# Patient Record
Sex: Male | Born: 1955 | Race: White | Hispanic: No | Marital: Single | Smoking: Never smoker
Health system: Southern US, Community
[De-identification: ages and names within clinical notes are randomized; demographics above are authoritative.]

## PROBLEM LIST (undated history)

## (undated) DIAGNOSIS — C61 Malignant neoplasm of prostate: Secondary | ICD-10-CM

## (undated) DIAGNOSIS — F419 Anxiety disorder, unspecified: Secondary | ICD-10-CM

## (undated) DIAGNOSIS — F329 Major depressive disorder, single episode, unspecified: Secondary | ICD-10-CM

## (undated) DIAGNOSIS — Z9889 Other specified postprocedural states: Secondary | ICD-10-CM

## (undated) DIAGNOSIS — F32A Depression, unspecified: Secondary | ICD-10-CM

## (undated) DIAGNOSIS — R112 Nausea with vomiting, unspecified: Secondary | ICD-10-CM

## (undated) DIAGNOSIS — K219 Gastro-esophageal reflux disease without esophagitis: Secondary | ICD-10-CM

## (undated) HISTORY — PX: XI ROBOTIC ASSISTED SIMPLE PROSTATECTOMY: SHX6713

## (undated) HISTORY — PX: ANKLE ARTHROSCOPY: SUR85

## (undated) HISTORY — PX: HAND SURGERY: SHX662

## (undated) HISTORY — PX: PROSTATE BIOPSY: SHX241

---

## 2005-04-13 ENCOUNTER — Ambulatory Visit (HOSPITAL_COMMUNITY): Admission: RE | Admit: 2005-04-13 | Discharge: 2005-04-13 | Payer: Self-pay | Admitting: Orthopedic Surgery

## 2011-10-11 ENCOUNTER — Ambulatory Visit (INDEPENDENT_AMBULATORY_CARE_PROVIDER_SITE_OTHER): Payer: 59 | Admitting: Psychology

## 2011-10-11 DIAGNOSIS — F331 Major depressive disorder, recurrent, moderate: Secondary | ICD-10-CM

## 2011-10-11 DIAGNOSIS — F411 Generalized anxiety disorder: Secondary | ICD-10-CM

## 2011-10-22 ENCOUNTER — Ambulatory Visit: Payer: 59 | Admitting: Psychology

## 2011-10-23 ENCOUNTER — Ambulatory Visit (INDEPENDENT_AMBULATORY_CARE_PROVIDER_SITE_OTHER): Payer: 59 | Admitting: Psychology

## 2011-10-23 DIAGNOSIS — F331 Major depressive disorder, recurrent, moderate: Secondary | ICD-10-CM

## 2011-10-23 DIAGNOSIS — F411 Generalized anxiety disorder: Secondary | ICD-10-CM

## 2011-12-06 ENCOUNTER — Ambulatory Visit (HOSPITAL_COMMUNITY): Admission: RE | Admit: 2011-12-06 | Payer: 59 | Source: Ambulatory Visit | Admitting: Gastroenterology

## 2011-12-06 ENCOUNTER — Encounter (HOSPITAL_COMMUNITY): Admission: RE | Payer: Self-pay | Source: Ambulatory Visit

## 2011-12-06 SURGERY — COLONOSCOPY
Anesthesia: Moderate Sedation

## 2013-06-20 ENCOUNTER — Emergency Department (INDEPENDENT_AMBULATORY_CARE_PROVIDER_SITE_OTHER)
Admission: EM | Admit: 2013-06-20 | Discharge: 2013-06-20 | Disposition: A | Discharge status: Home / Self Care | Payer: PRIVATE HEALTH INSURANCE | Source: Home / Self Care | Attending: Family Medicine | Admitting: Family Medicine

## 2013-06-20 ENCOUNTER — Emergency Department (INDEPENDENT_AMBULATORY_CARE_PROVIDER_SITE_OTHER): Payer: PRIVATE HEALTH INSURANCE

## 2013-06-20 ENCOUNTER — Encounter (HOSPITAL_COMMUNITY): Payer: Self-pay | Admitting: *Deleted

## 2013-06-20 DIAGNOSIS — S62639B Displaced fracture of distal phalanx of unspecified finger, initial encounter for open fracture: Secondary | ICD-10-CM

## 2013-06-20 MED ORDER — TETANUS-DIPHTHERIA TOXOIDS TD 5-2 LFU IM INJ
INJECTION | INTRAMUSCULAR | Status: AC
  Filled 2013-06-20: qty 0.5

## 2013-06-20 MED ORDER — HYDROCODONE-ACETAMINOPHEN 7.5-325 MG PO TABS: 1.0000 | ORAL_TABLET | Freq: Four times a day (QID) | ORAL | Status: DC | PRN

## 2013-06-20 MED ORDER — TETANUS-DIPHTH-ACELL PERTUSSIS 5-2.5-18.5 LF-MCG/0.5 IM SUSP
0.5000 mL | Freq: Once | INTRAMUSCULAR | Status: AC
  Administered 2013-06-20: 0.5 mL via INTRAMUSCULAR

## 2013-06-20 MED ORDER — ONDANSETRON 4 MG PO TBDP
ORAL_TABLET | ORAL | Status: AC
  Filled 2013-06-20: qty 2

## 2013-06-20 NOTE — ED Provider Notes (Signed)
Medical screening examination/treatment/procedure(s) were performed by non-physician practitioner and as supervising physician I was immediately available for consultation/collaboration.  Raynald Blend, MD 06/20/13 1243

## 2013-06-20 NOTE — ED Notes (Signed)
Awaiting  Dr  Merlyn Lot      Remains  Npo

## 2013-06-20 NOTE — ED Provider Notes (Signed)
CSN: [id number]     Arrival date & time 06/20/13  0915 History     None    Chief Complaint  Patient presents with  . Finger Injury   (Consider location/radiation/quality/duration/timing/severity/associated sxs/prior Treatment) HPI Comments: 57 year old male presents for evaluation and treatment of a finger injury. He smashed his left middle finger under a heavy table about 20 minutes prior to arrival here. The finger is extremely painful. He has had significant bleeding from the finger that has been controlled with direct pressure. He denies any numbness distal to the injury, history of blood dyscrasias, or previous injury to the finger.   History reviewed. No pertinent past medical history. History reviewed. No pertinent past surgical history. History reviewed. No pertinent family history. History  Substance Use Topics  . Smoking status: Never Smoker   . Smokeless tobacco: Not on file  . Alcohol Use: No    Review of Systems  Constitutional: Negative for fever, chills and fatigue.  HENT: Negative for sore throat, neck pain and neck stiffness.   Eyes: Negative for visual disturbance.  Respiratory: Negative for cough and shortness of breath.   Cardiovascular: Negative for chest pain, palpitations and leg swelling.  Gastrointestinal: Positive for nausea. Negative for vomiting, abdominal pain, diarrhea and constipation.  Genitourinary: Negative for dysuria, urgency, frequency and hematuria.  Musculoskeletal: Negative for myalgias and arthralgias.  Skin: Positive for wound (see history of present illness). Negative for rash.  Neurological: Positive for light-headedness. Negative for dizziness and weakness.    Allergies  Review of patient's allergies indicates no known allergies.  Home Medications   Current Outpatient Rx  Name  Route  Sig  Dispense  Refill  . HYDROcodone-acetaminophen (NORCO) 7.5-325 MG per tablet   Oral   Take 1 tablet by mouth every 6 (six) hours as needed  for pain.   30 tablet   0    BP 140/76  Pulse 72  Temp(Src) 98.6 F (37 C) (Oral)  Resp 16  SpO2 100% Physical Exam  Nursing note and vitals reviewed. Constitutional: He is oriented to person, place, and time. He appears well-developed and well-nourished. He appears distressed.  Musculoskeletal:       Left hand: He exhibits decreased range of motion, tenderness, bony tenderness, decreased capillary refill (in the tissue lateral to the laceration), deformity, laceration and swelling.       Hands: Neurological: He is alert and oriented to person, place, and time. No cranial nerve deficit. Coordination normal.  Skin: Skin is warm. No rash noted. He is diaphoretic.  Psychiatric: He has a normal mood and affect. Judgment normal.    ED Course   Procedures (including critical care time)  Labs Reviewed - No data to display Dg Finger Middle Left  06/20/2013   *RADIOLOGY REPORT*  Clinical Data: Crush injury to left middle finger.  LEFT MIDDLE FINGER 2+V  Comparison: None  Findings: A comminuted tuft fracture is identified. There is no evidence of subluxation or dislocation. No unexpected radiopaque foreign bodies are identified.  IMPRESSION: Comminuted tuft fracture.   Original Report Authenticated By: Harmon Pier, M.D.   1. Open fracture of distal phalangeal tuft, initial encounter     Discussed with Dr. Merlyn Lot, hand surgeon on call. He is coming here to take a look at the patient's finger. He has requested that the patient remain n.p.o. and to ensure that his tetanus is up-to-date.  Dr. Merlyn Lot will fix this laceration in the office here today  MDM  Dr. Merlyn Lot has repaired  the laceration and will see this patient in the office this week.   Meds ordered this encounter  Medications  . TDaP (BOOSTRIX) injection 0.5 mL    Sig:   . HYDROcodone-acetaminophen (NORCO) 7.5-325 MG per tablet    Sig: Take 1 tablet by mouth every 6 (six) hours as needed for pain.    Dispense:  30 tablet     Refill:  0     Graylon Good, PA-C 06/20/13 1239

## 2013-06-20 NOTE — ED Notes (Signed)
Pt  Dropped  A  Heavy  Table  On  l        Middle  Finger  Lac  And       Pain    Swelling    /  Present

## 2013-06-20 NOTE — Consult Note (Signed)
NAMERYAN, OGBORN NO.:  [id number]  MEDICAL RECORD NO.:  [id number]  LOCATION:  [facility]                         FACILITY:  [facility]  PHYSICIAN:  Betha Loa, MD        DATE OF BIRTH:  03/27/1956  DATE OF CONSULTATION:  06/20/2013 DATE OF DISCHARGE:  06/20/2013                                CONSULTATION   Consult from [facility].  Consult is for left long finger tip crush injury.  HISTORY:  Steven Roy is a 57 year old right-hand dominant male, who states that this morning while moving some table top, he pinched the tip of the left long finger between the table top and the surface below. This caused lacerations to the fingertip as well as some hematoma.  He presented to [facility] where he was evaluated.  Radiographs revealed a tuft fracture.  I was consulted for management of the injury. He reports no previous injury and no other injury at this time.  ALLERGIES:  Penicillin cause an allergy as a child.  PAST MEDICAL HISTORY:  None.  PAST SURGICAL HISTORY:  Ankle fixation.  MEDICATIONS:  None.  SOCIAL HISTORY:  Steven Roy works for [employer].  He does not smoke or use alcohol.  REVIEW OF SYSTEMS:  A 13-point review of systems negative.  PHYSICAL EXAMINATION:  GENERAL:  Alert, oriented x3, well developed, well nourished, regular gait. EXTREMITIES:  Bilateral upper extremities are intact to light touch, sensation, capillary refill in all fingertips with the exception of the left long finger where he has had a digital block.  He has intact capillary refill.  There is subungual hematoma approximately 50% of the nail.  There is laceration on the radial side of the finger coursing around into the pad as well as a separate small laceration of the pad of the finger.  There was no gross contamination.  RADIOGRAPHS:  AP, lateral, and oblique views of the finger show tuft fracture with comminution.  There was no intra-articular  extension.  ASSESSMENT AND PLAN:  Left long fingertip crush injury.  Discussed with Steven Roy and his son, the nature of the injury.  We discussed treatment of the injury with irrigation and debridement of the Wounds and fracture and repair of wounds.  This will require removal of  the nail as well. Risks, benefits, and alternatives of procedure were  discussed and he wished to proceed.  PROCEDURE NOTE:  Digital block had been performed with 0.25% plain Marcaine by the urgent care staff.  This was adequate to give total digital anesthesia.  The wounds were copiously irrigated with 1000 mL of sterile saline and Betadine solution.  The finger was then prepped with Betadine and sterilely draped.  Penrose drain was used as a tourniquet, was up for approximately 30 minutes.  The fingernail was removed with a Therapist, nutritional.  There was laceration transversely across the nail and the hyponychium had been split as well at the radial side.  This  communicated with the fracture.  A 5-0 Monocryl suture was used in an  interrupted fashion to repair the skin lacerations.  This apposed the  soft tissues well.  Again there  was noted to be no gross contamination.   A 6-0 chromic suture was used to repair the nail bed laceration.  This  was done in an interrupted fashion.  Good apposition of the nail bed  tissues was obtained.  Horizontal mattress sutures were used at the  hyponychium to repair the split in this area. Again good apposition of  tissues had been obtained.  A piece of Xeroform was placed in the nail fold and all wounds were dressed with sterile Xeroform and 4x4, and wrapped with a Kling dressing.  An AlumaFoam splint was placed on the dorsum of the finger and wrapped with a Coban dressing lightly.  The Penrose drain was removed.  He tolerated the procedure well.  He will be given pain medications per the urgent care  facility.  There was no significant gross contamination.  The  wound was not open for an extended period of time, so I do not feel he needs antibiotics at this time.  I will see him back in the office in approximately 1 week for postprocedure followup.  He tolerated the procedure well.     Betha Loa, MD     KK/MEDQ  D:  06/20/2013  T:  06/20/2013  Job:  [id number]

## 2015-11-27 MED FILL — ATORVASTATIN 80 MG TABLET: 80 | 15 days supply | Qty: 15 | Fill #0

## 2016-01-11 DIAGNOSIS — E782 Mixed hyperlipidemia: Secondary | ICD-10-CM | POA: Insufficient documentation

## 2016-01-12 DIAGNOSIS — E782 Mixed hyperlipidemia: Secondary | ICD-10-CM | POA: Diagnosis not present

## 2016-01-12 MED FILL — ATORVASTATIN 80 MG TABLET: 80 | 90 days supply | Qty: 90 | Fill #0

## 2016-05-27 MED FILL — ATORVASTATIN 80 MG TABLET: 80 | 90 days supply | Qty: 90 | Fill #1

## 2016-08-26 MED FILL — ATORVASTATIN 80 MG TABLET: 80 | 90 days supply | Qty: 90 | Fill #2

## 2016-11-19 DIAGNOSIS — M542 Cervicalgia: Secondary | ICD-10-CM | POA: Diagnosis not present

## 2016-11-19 DIAGNOSIS — M25512 Pain in left shoulder: Secondary | ICD-10-CM | POA: Diagnosis not present

## 2016-11-22 DIAGNOSIS — M25512 Pain in left shoulder: Secondary | ICD-10-CM | POA: Diagnosis not present

## 2016-11-22 DIAGNOSIS — M542 Cervicalgia: Secondary | ICD-10-CM | POA: Diagnosis not present

## 2016-11-25 MED FILL — ATORVASTATIN 80 MG TABLET: 80 | 90 days supply | Qty: 90 | Fill #3

## 2016-12-02 DIAGNOSIS — M25512 Pain in left shoulder: Secondary | ICD-10-CM | POA: Diagnosis not present

## 2016-12-02 DIAGNOSIS — M542 Cervicalgia: Secondary | ICD-10-CM | POA: Diagnosis not present

## 2016-12-04 DIAGNOSIS — M542 Cervicalgia: Secondary | ICD-10-CM | POA: Diagnosis not present

## 2016-12-04 DIAGNOSIS — M25512 Pain in left shoulder: Secondary | ICD-10-CM | POA: Diagnosis not present

## 2017-10-30 ENCOUNTER — Telehealth: Payer: 59 | Admitting: Family

## 2017-10-30 DIAGNOSIS — J028 Acute pharyngitis due to other specified organisms: Secondary | ICD-10-CM | POA: Diagnosis not present

## 2017-10-30 DIAGNOSIS — B9689 Other specified bacterial agents as the cause of diseases classified elsewhere: Secondary | ICD-10-CM

## 2017-10-30 MED ORDER — AZITHROMYCIN 250 MG PO TABS: ORAL_TABLET | ORAL | 0 refills | Status: DC

## 2017-10-30 MED ORDER — PREDNISONE 5 MG PO TABS: 5.0000 mg | ORAL_TABLET | ORAL | 0 refills | Status: DC

## 2017-10-30 MED ORDER — BENZONATATE 100 MG PO CAPS: 100.0000 mg | ORAL_CAPSULE | Freq: Three times a day (TID) | ORAL | 0 refills | Status: DC | PRN

## 2017-10-30 MED FILL — AZITHROMYCIN 250 MG TAB: 250 | 5 days supply | Qty: 6 | Fill #0

## 2017-10-30 MED FILL — BENZONATATE 100 MG CAP: 100 | 5 days supply | Qty: 30 | Fill #0

## 2017-10-30 MED FILL — predniSONE 5 MG (21) TBPK: 5 | 6 days supply | Qty: 21 | Fill #0

## 2017-10-30 NOTE — Progress Notes (Signed)

## 2017-11-26 DIAGNOSIS — H5213 Myopia, bilateral: Secondary | ICD-10-CM | POA: Diagnosis not present

## 2017-11-26 DIAGNOSIS — H524 Presbyopia: Secondary | ICD-10-CM | POA: Diagnosis not present

## 2017-12-03 ENCOUNTER — Telehealth: Payer: 59 | Admitting: Family

## 2017-12-03 DIAGNOSIS — R399 Unspecified symptoms and signs involving the genitourinary system: Secondary | ICD-10-CM

## 2017-12-03 DIAGNOSIS — N390 Urinary tract infection, site not specified: Secondary | ICD-10-CM | POA: Diagnosis not present

## 2017-12-03 MED FILL — CIPROFLOXACIN HCL 500 MG TA: 500 | 28 days supply | Qty: 56 | Fill #0

## 2017-12-03 MED FILL — ATORVASTATIN 80 MG TABLET: 80 | 90 days supply | Qty: 90 | Fill #0

## 2017-12-03 NOTE — Progress Notes (Signed)
We are sorry that you are not feeling well.  Here is how we plan to help!  Male bladder infections are not very common.  We worry about prostate or kidney conditions.  The standard of care is to examine the abdomen and kidneys, and to do a urine and blood test to make sure that something more serious is not going on.  We recommend that you see a provider today.  If your doctor's office is closed [facility] has the following Urgent Cares:  If you need care fast and have a high deductible or no insurance consider: You will not be charged for this Evisit!   [email or web address]  [phone]  [address] [place], [place] [address] 8 am to 8 pm Monday-Friday 10 am to 4 pm Saturday-Sunday   The following sites will take your  insurance:    . [facility] [place] a Provider at this Location  [address] [place], [place] [address] . 10 am to 8 pm Monday-Friday . 12 pm to 8 pm Saturday-Sunday   . [facility] Urgent [facility] at [place] a Provider at this Location  [place], [place], [place] [address] . 8 am to 8 pm Monday-Friday . 9 am to 6 pm Saturday . 11 am to 6 pm Sunday   . [facility]gent [facility] at [place] Get Driving Directions  [address] [place], [place] [address] . 8 am to 8 pm Monday-Friday . 8 am to 4 pm Saturday-Sunday   . [facility] (a walk in primary care provider)  [place] a Provider at this Location  [place], [place] [address] . 8 am to 8:30 pm Monday-Thursday . 8 am to 6 pm Friday . 8 am to 4 pm Saturday-Sunday   Your e-visit answers were reviewed by a board certified advanced clinical practitioner to complete your personal care plan.  Thank you for using e-Visits.

## 2018-01-13 DIAGNOSIS — H40013 Open angle with borderline findings, low risk, bilateral: Secondary | ICD-10-CM | POA: Diagnosis not present

## 2018-01-30 DIAGNOSIS — Z0000 Encounter for general adult medical examination without abnormal findings: Secondary | ICD-10-CM | POA: Diagnosis not present

## 2018-02-07 ENCOUNTER — Telehealth: Payer: 59 | Admitting: Nurse Practitioner

## 2018-02-07 DIAGNOSIS — N3000 Acute cystitis without hematuria: Secondary | ICD-10-CM | POA: Diagnosis not present

## 2018-02-07 MED ORDER — CEPHALEXIN 500 MG PO CAPS: 500.0000 mg | ORAL_CAPSULE | Freq: Two times a day (BID) | ORAL | 0 refills | Status: DC

## 2018-02-07 NOTE — Progress Notes (Signed)

## 2018-02-07 NOTE — Addendum Note (Signed)
Addended by: Chevis Pretty on: 02/07/2018 02:35 PM   Modules accepted: Orders

## 2018-02-09 DIAGNOSIS — E782 Mixed hyperlipidemia: Secondary | ICD-10-CM | POA: Diagnosis not present

## 2018-02-09 DIAGNOSIS — Z0000 Encounter for general adult medical examination without abnormal findings: Secondary | ICD-10-CM | POA: Diagnosis not present

## 2018-02-09 DIAGNOSIS — R972 Elevated prostate specific antigen [PSA]: Secondary | ICD-10-CM | POA: Diagnosis not present

## 2018-02-09 DIAGNOSIS — D649 Anemia, unspecified: Secondary | ICD-10-CM | POA: Diagnosis not present

## 2018-02-09 DIAGNOSIS — Z79899 Other long term (current) drug therapy: Secondary | ICD-10-CM | POA: Diagnosis not present

## 2018-02-09 DIAGNOSIS — F430 Acute stress reaction: Secondary | ICD-10-CM | POA: Diagnosis not present

## 2018-02-09 DIAGNOSIS — R310 Gross hematuria: Secondary | ICD-10-CM | POA: Diagnosis not present

## 2018-02-09 MED FILL — ESCITALOPRAM 10 MG TABLET: 10 | 90 days supply | Qty: 83 | Fill #0

## 2018-02-09 MED FILL — SULFAMETHOXAZOLE-TMP DS TAB: 800-160 | 30 days supply | Qty: 60 | Fill #0

## 2018-02-28 DIAGNOSIS — Z1212 Encounter for screening for malignant neoplasm of rectum: Secondary | ICD-10-CM | POA: Diagnosis not present

## 2018-02-28 DIAGNOSIS — Z1211 Encounter for screening for malignant neoplasm of colon: Secondary | ICD-10-CM | POA: Diagnosis not present

## 2018-03-16 MED FILL — ATORVASTATIN 80 MG TABLET: 80 | 90 days supply | Qty: 90 | Fill #0

## 2018-05-13 DIAGNOSIS — Z1159 Encounter for screening for other viral diseases: Secondary | ICD-10-CM | POA: Diagnosis not present

## 2018-05-13 DIAGNOSIS — R972 Elevated prostate specific antigen [PSA]: Secondary | ICD-10-CM | POA: Diagnosis not present

## 2018-05-13 DIAGNOSIS — D649 Anemia, unspecified: Secondary | ICD-10-CM | POA: Diagnosis not present

## 2018-05-17 DIAGNOSIS — D509 Iron deficiency anemia, unspecified: Secondary | ICD-10-CM | POA: Insufficient documentation

## 2018-06-29 DIAGNOSIS — R972 Elevated prostate specific antigen [PSA]: Secondary | ICD-10-CM | POA: Diagnosis not present

## 2018-06-29 MED FILL — SULFAMETHOXAZOLE-TMP DS TAB: 800-160 | 1 days supply | Qty: 2 | Fill #0

## 2018-07-06 MED FILL — ATORVASTATIN 80 MG TABLET: 80 | 90 days supply | Qty: 90 | Fill #1

## 2018-07-25 ENCOUNTER — Telehealth: Payer: 59 | Admitting: Nurse Practitioner

## 2018-07-25 DIAGNOSIS — N3000 Acute cystitis without hematuria: Secondary | ICD-10-CM | POA: Diagnosis not present

## 2018-07-25 MED ORDER — CIPROFLOXACIN HCL 500 MG PO TABS: 500.0000 mg | ORAL_TABLET | Freq: Two times a day (BID) | ORAL | 0 refills | Status: DC

## 2018-07-25 NOTE — Progress Notes (Signed)

## 2018-08-03 DIAGNOSIS — R972 Elevated prostate specific antigen [PSA]: Secondary | ICD-10-CM | POA: Diagnosis not present

## 2018-08-11 ENCOUNTER — Other Ambulatory Visit: Payer: Self-pay | Admitting: Urology

## 2018-08-11 DIAGNOSIS — C61 Malignant neoplasm of prostate: Secondary | ICD-10-CM | POA: Diagnosis not present

## 2018-08-11 DIAGNOSIS — F32A Depression, unspecified: Secondary | ICD-10-CM | POA: Insufficient documentation

## 2018-08-11 DIAGNOSIS — F329 Major depressive disorder, single episode, unspecified: Secondary | ICD-10-CM | POA: Insufficient documentation

## 2018-08-12 MED FILL — FLUoxetine HCL 10 MG TABS: 10 | 30 days supply | Qty: 27 | Fill #0

## 2018-08-19 DIAGNOSIS — C61 Malignant neoplasm of prostate: Secondary | ICD-10-CM | POA: Diagnosis not present

## 2018-08-20 ENCOUNTER — Telehealth: Payer: 59 | Admitting: Family

## 2018-08-20 ENCOUNTER — Encounter (HOSPITAL_COMMUNITY)
Admission: RE | Admit: 2018-08-20 | Discharge: 2018-08-20 | Disposition: A | Discharge status: Home / Self Care | Payer: 59 | Source: Ambulatory Visit | Attending: Urology | Admitting: Urology

## 2018-08-20 DIAGNOSIS — R300 Dysuria: Secondary | ICD-10-CM | POA: Diagnosis not present

## 2018-08-20 DIAGNOSIS — C61 Malignant neoplasm of prostate: Secondary | ICD-10-CM | POA: Diagnosis not present

## 2018-08-20 DIAGNOSIS — R8279 Other abnormal findings on microbiological examination of urine: Secondary | ICD-10-CM | POA: Diagnosis not present

## 2018-08-20 MED ORDER — TECHNETIUM TC 99M MEDRONATE IV KIT
20.5000 | PACK | Freq: Once | INTRAVENOUS | Status: AC | PRN
  Administered 2018-08-20: 20.5 via INTRAVENOUS

## 2018-08-20 MED FILL — SULFAMETHOXAZOLE-TMP DS TAB: 800-160 | 7 days supply | Qty: 14 | Fill #0

## 2018-08-20 NOTE — Progress Notes (Signed)
Thank you for the details you included in the comment boxes. Those details are very helpful in determining the best course of treatment for you and help [place] to provide the best care. Given your recent cancer diagnosis in addition to men rarely having a UTI, this is important to be seen face-to-face ASAP.  We are sorry that you are not feeling well.  Here is how we plan to help!  Male bladder infections are not very common.  We worry about prostate or kidney conditions.  The standard of care is to examine the abdomen and kidneys, and to do a urine and blood test to make sure that something more serious is not going on.  We recommend that you see a provider today.  If your doctor's office is closed [facility] has the following Urgent Cares:  If you need care fast and have a high deductible or no insurance consider:  [email or web address] to reserve your spot online an avoid wait times  [facility] [address], [address] [place], [place] [address] 8 am to 8 pm Monday-Friday 10 am to 4 pm Saturday-Sunday *Across the street from [employer]  [place], [address] 8 am to 5 pm Monday-Friday * In the [facility] on the [facility]   The following sites will take your  insurance:  . [facility]gent [place] a Provider at this Location  [address] [place], [place] [address] . 10 am to 8 pm Monday-Friday . 12 pm to 8 pm Saturday-Sunday   . [facility] Urgent Care at [place] a Provider at this Location  [place], [place], [place] [address] . 8 am to 8 pm Monday-Friday . 9 am to 6 pm Saturday . 11 am to 6 pm Sunday   . [facility] Health Urgent Care at [place] Get Driving Directions  [address] [place], [place] [address] . 8 am to 8 pm Monday-Friday . 8  am to 4 pm Saturday-Sunday   Your e-visit answers were reviewed by a board certified advanced clinical practitioner to complete your personal care plan.  Thank you for using e-Visits.

## 2018-08-24 DIAGNOSIS — C61 Malignant neoplasm of prostate: Secondary | ICD-10-CM | POA: Diagnosis not present

## 2018-08-25 ENCOUNTER — Other Ambulatory Visit: Payer: Self-pay | Admitting: Urology

## 2018-09-04 DIAGNOSIS — C61 Malignant neoplasm of prostate: Secondary | ICD-10-CM | POA: Diagnosis not present

## 2018-09-04 DIAGNOSIS — M6281 Muscle weakness (generalized): Secondary | ICD-10-CM | POA: Diagnosis not present

## 2018-09-14 DIAGNOSIS — N393 Stress incontinence (female) (male): Secondary | ICD-10-CM | POA: Diagnosis not present

## 2018-09-14 DIAGNOSIS — M62838 Other muscle spasm: Secondary | ICD-10-CM | POA: Diagnosis not present

## 2018-09-14 DIAGNOSIS — M6281 Muscle weakness (generalized): Secondary | ICD-10-CM | POA: Diagnosis not present

## 2018-09-15 DIAGNOSIS — C61 Malignant neoplasm of prostate: Secondary | ICD-10-CM | POA: Diagnosis not present

## 2018-09-15 NOTE — Patient Instructions (Addendum)
Steven Roy  09/15/2018   Your procedure is scheduled on: 09-30-18  Report to [facility] Main  Entrance              Report to admitting at    1200 PM    Call this number if you have problems the morning of surgery [phone]               Follow a clear liquid diet the day before surgery               Drink one bottle of magnesium citrate by noon the day before your surgery    CLEAR LIQUID DIET   Foods Allowed                                                                     Foods Excluded  Coffee and tea, regular and decaf                             liquids that you cannot  Plain Jell-O in any flavor                                             see through such as: Fruit ices (not with fruit pulp)                                     milk, soups, orange juice  Iced Popsicles                                    All solid food Carbonated beverages, regular and diet                                    Cranberry, grape and apple juices Sports drinks like Gatorade Lightly seasoned clear broth or consume(fat free) Sugar, honey syrup  Sample Menu Breakfast                                Lunch                                     Supper Cranberry juice                    Beef broth                            Chicken broth Jell-O  Grape juice                           Apple juice Coffee or tea                        Jell-O                                      Popsicle                                                Coffee or tea                        Coffee or tea  _____________________________________________________________________    Remember: Do not eat food:After Midnight. Clear liquids until 0800 am then nothing by mouth  BRUSH YOUR TEETH MORNING OF SURGERY AND RINSE YOUR MOUTH OUT, NO CHEWING GUM CANDY OR MINTS.     Take these medicines the morning of surgery with A SIP OF WATER: lipitor, ranitidine                            You may not have any metal on your body including hair pins and              piercings  Do not wear jewelry,  lotions, powders or perfumes, deodorant                   Men may shave face and neck.   Do not bring valuables to the hospital. [place].  Contacts, dentures or bridgework may not be worn into surgery.  Leave suitcase in the car. After surgery it may be brought to your room.     Patients discharged the day of surgery will not be allowed to drive home.  Name and phone number of your driver:  Special Instructions: N/A              Please read over the following fact sheets you were given: _____________________________________________________________________             [facility] - Preparing for Surgery Before surgery, you can play an important role.  Because skin is not sterile, your skin needs to be as free of germs as possible.  You can reduce the number of germs on your skin by washing with CHG (chlorahexidine gluconate) soap before surgery.  CHG is an antiseptic cleaner which kills germs and bonds with the skin to continue killing germs even after washing. Please DO NOT use if you have an allergy to CHG or antibacterial soaps.  If your skin becomes reddened/irritated stop using the CHG and inform your nurse when you arrive at Short Stay. Do not shave (including legs and underarms) for at least 48 hours prior to the first CHG shower.  You may shave your face/neck. Please follow these instructions carefully:  1.  Shower with CHG Soap the night before surgery and the  morning of Surgery.  2.  If you choose to wash your hair, wash  your hair first as usual with your  normal  shampoo.  3.  After you shampoo, rinse your hair and body thoroughly to remove the  shampoo.                           4.  Use CHG as you would any other liquid soap.  You can apply chg directly  to the skin and wash                        Gently with a scrungie or clean washcloth.  5.  Apply the CHG Soap to your body ONLY FROM THE NECK DOWN.   Do not use on face/ open                           Wound or open sores. Avoid contact with eyes, ears mouth and genitals (private parts).                       Wash face,  Genitals (private parts) with your normal soap.             6.  Wash thoroughly, paying special attention to the area where your surgery  will be performed.  7.  Thoroughly rinse your body with warm water from the neck down.  8.  DO NOT shower/wash with your normal soap after using and rinsing off  the CHG Soap.                9.  Pat yourself dry with a clean towel.            10.  Wear clean pajamas.            11.  Place clean sheets on your bed the night of your first shower and do not  sleep with pets. Day of Surgery : Do not apply any lotions/deodorants the morning of surgery.  Please wear clean clothes to the hospital/surgery center.  FAILURE TO FOLLOW THESE INSTRUCTIONS MAY RESULT IN THE CANCELLATION OF YOUR SURGERY PATIENT SIGNATURE_________________________________  NURSE SIGNATURE__________________________________  ________________________________________________________________________

## 2018-09-16 ENCOUNTER — Ambulatory Visit
Admission: RE | Admit: 2018-09-16 | Discharge: 2018-09-16 | Disposition: A | Discharge status: Home / Self Care | Payer: 59 | Source: Ambulatory Visit | Attending: Radiation Oncology | Admitting: Radiation Oncology

## 2018-09-16 ENCOUNTER — Encounter: Payer: Self-pay | Admitting: Radiation Oncology

## 2018-09-16 ENCOUNTER — Other Ambulatory Visit: Payer: Self-pay

## 2018-09-16 VITALS — BP 111/85 | HR 74 | Temp 98.4°F | Resp 18 | Ht 74.0 in | Wt 211.4 lb

## 2018-09-16 DIAGNOSIS — Z8042 Family history of malignant neoplasm of prostate: Secondary | ICD-10-CM | POA: Diagnosis not present

## 2018-09-16 DIAGNOSIS — Z79899 Other long term (current) drug therapy: Secondary | ICD-10-CM | POA: Insufficient documentation

## 2018-09-16 DIAGNOSIS — C61 Malignant neoplasm of prostate: Secondary | ICD-10-CM

## 2018-09-16 DIAGNOSIS — R972 Elevated prostate specific antigen [PSA]: Secondary | ICD-10-CM | POA: Diagnosis not present

## 2018-09-16 HISTORY — DX: Anxiety disorder, unspecified: F41.9

## 2018-09-16 HISTORY — DX: Malignant neoplasm of prostate: C61

## 2018-09-16 HISTORY — DX: Major depressive disorder, single episode, unspecified: F32.9

## 2018-09-16 HISTORY — DX: Gastro-esophageal reflux disease without esophagitis: K21.9

## 2018-09-16 HISTORY — DX: Depression, unspecified: F32.A

## 2018-09-16 NOTE — Progress Notes (Signed)
See progress note under physician encounter. 

## 2018-09-16 NOTE — Progress Notes (Signed)
Radiation Oncology         [phone] ________________________________  Initial outpatient Consultation  Name: Steven Roy MRN: [id number]  Date: 09/16/2018  DOB: [birth year]  KG:[id number], Steven Conroy, MD  Alexis Frock, MD   REFERRING PHYSICIAN: Alexis Frock, MD  DIAGNOSIS: 62 y.o. gentleman with Stage T1c adenocarcinoma of the prostate with Gleason score of 4+3, and PSA of 32.6.    ICD-10-CM   1. Malignant neoplasm of prostate [facility]) C61 Ambulatory referral to Social Work    HISTORY OF PRESENT ILLNESS: Steven Roy is a 62 y.o. male with a diagnosis of prostate cancer. He has a longstanding history of elevated PSA, initially noted to have an elevated PSA of 5.56 by his primary care physician, Dr. Daron Offer, in 2012.  Accordingly, he was referred for evaluation in urology, but he did not comply with this referral. His PSA was repeated in 09/2013 and was further elevated at 15.02.  He was again referred for evaluation in Urology and at that time was scheduled for TRUSPBx but did not show for his biopsy. He had another repeat PSA in August 2019 which was further elevated at 32.6. He was referred back to Urology for further evaluation with Dr. Tresa Moore on 08/03/2018,  digital rectal examination was performed at that time revealing a smooth, symmetrically enlarged prostate.  The patient proceeded to transrectal ultrasound with 12 biopsies of the prostate on 08/08/2018.  The prostate volume measured 77 cc.  Out of 12 core biopsies, 4 were positive.  The maximum Gleason score was 4+3, and this was seen in left lateral base and left mid gland.  CT A/P on 08/19/2018 showed no evidence of metastatic disease, lymphadenopathy or suspicious osseous lesions. A likely-benign renal lesion was noted incidentally.  Bone scan on 08/20/2018 confirmed no scintigraphic evidence of osseous metastatic disease,  The patient reviewed the biopsy results with his urologist and he has kindly been referred today for  discussion of potential radiation treatment options. He is currently scheduled for prostatectomy on 09/30/2018 with Dr. Tresa Moore but presents today to learn more about radiation.  PREVIOUS RADIATION THERAPY: No  PAST MEDICAL HISTORY:  Past Medical History:  Diagnosis Date  . Anxiety   . Depression   . GERD (gastroesophageal reflux disease)   . Prostate cancer ([place])       PAST SURGICAL HISTORY: Past Surgical History:  Procedure Laterality Date  . ANKLE ARTHROSCOPY    . HAND SURGERY    . PROSTATE BIOPSY      FAMILY HISTORY:  Family History  Problem Relation Age of Onset  . Cancer Father        unknown  . Prostate cancer Brother        surgery followed by radiation  . Breast cancer Neg Hx   . Colon cancer Neg Hx   . Pancreatic cancer Neg Hx     SOCIAL HISTORY:  Social History   Socioeconomic History  . Marital status: Divorced    Spouse name: Not on file  . Number of children: 4  . Years of education: Not on file  . Highest education level: Not on file  Occupational History    Comment: Mudlogger of [employer]   Social Needs  . Financial resource strain: Not on file  . Food insecurity:    Worry: Not on file    Inability: Not on file  . Transportation needs:    Medical: Not on file    Non-medical: Not on file  Tobacco Use  .  Smoking status: Never Smoker  . Smokeless tobacco: Never Used  Substance and Sexual Activity  . Alcohol use: No  . Drug use: Never  . Sexual activity: Yes  Lifestyle  . Physical activity:    Days per week: Not on file    Minutes per session: Not on file  . Stress: Not on file  Relationships  . Social connections:    Talks on phone: Not on file    Gets together: Not on file    Attends religious service: Not on file    Active member of club or organization: Not on file    Attends meetings of clubs or organizations: Not on file    Relationship status: Not on file  . Intimate partner violence:    Fear of current or ex partner: Not on  file    Emotionally abused: Not on file    Physically abused: Not on file    Forced sexual activity: Not on file  Other Topics Concern  . Not on file  Social History Narrative   Resides in [place].    ALLERGIES: Patient has no known allergies.  MEDICATIONS:  Current Outpatient Medications  Medication Sig Dispense Refill  . atorvastatin (LIPITOR) 80 MG tablet Take 80 mg by mouth daily.    . IRON PO Take 1 tablet by mouth daily.    . Multiple Vitamins-Minerals (MULTIVITAMIN PO) Take 1 tablet by mouth daily.    . ranitidine (ZANTAC) 150 MG tablet Take 150 mg by mouth 2 (two) times daily.     No current facility-administered medications for this encounter.     REVIEW OF SYSTEMS:  On review of systems, the patient reports that he is doing well overall. He denies any chest pain, shortness of breath, cough, fevers, chills, night sweats, unintended weight changes. He denies any bowel disturbances, and denies abdominal pain, nausea or vomiting. He denies any new musculoskeletal or joint aches or pains. His IPSS was 12, indicating moderate urinary symptoms. His SHIM was 15, indicating he has mild erectile dysfunction. A complete review of systems is obtained and is otherwise negative.    PHYSICAL EXAM:  Wt Readings from Last 3 Encounters:  09/16/18 211 lb 6.4 oz (95.9 kg)   Temp Readings from Last 3 Encounters:  09/16/18 98.4 F (36.9 C) (Oral)  06/20/13 98.6 F (37 C) (Oral)   BP Readings from Last 3 Encounters:  09/16/18 111/85  06/20/13 140/76   Pulse Readings from Last 3 Encounters:  09/16/18 74  06/20/13 72   Pain Assessment Pain Score: 0-No pain/10  In general this is a well appearing Caucasian gentleman in no acute distress. He is alert and oriented x4 and appropriate throughout the examination. HEENT reveals that the patient is normocephalic, atraumatic. EOMs are intact. PERRLA. Skin is intact without any evidence of gross lesions. Cardiovascular exam reveals a regular  rate and rhythm, no clicks rubs or murmurs are auscultated. Chest is clear to auscultation bilaterally. Lymphatic assessment is performed and does not reveal any adenopathy in the cervical, supraclavicular, axillary, or inguinal chains. Abdomen has active bowel sounds in all quadrants and is intact. The abdomen is soft, non tender, non distended. Lower extremities are negative for pretibial pitting edema, deep calf tenderness, cyanosis or clubbing.   KPS = 100  100 - Normal; no complaints; no evidence of disease. 90   - Able to carry on normal activity; minor signs or symptoms of disease. 80   - Normal activity with effort; some signs or  symptoms of disease. 17   - Cares for self; unable to carry on normal activity or to do active work. 60   - Requires occasional assistance, but is able to care for most of his personal needs. 50   - Requires considerable assistance and frequent medical care. 68   - Disabled; requires special care and assistance. 23   - Severely disabled; hospital admission is indicated although death not imminent. 72   - Very sick; hospital admission necessary; active supportive treatment necessary. 10   - Moribund; fatal processes progressing rapidly. 0     - Dead  Karnofsky DA, Abelmann WH, Craver LS and Burchenal JH [phone]) The use of the nitrogen mustards in the palliative treatment of carcinoma: with particular reference to bronchogenic carcinoma Cancer 1 634-56  LABORATORY DATA:  No results found for: WBC, HGB, HCT, MCV, PLT No results found for: NA, K, CL, CO2 No results found for: ALT, AST, GGT, ALKPHOS, BILITOT   RADIOGRAPHY: Nm Bone Scan Whole Body  Result Date: 08/20/2018 CLINICAL DATA:  Recent diagnosis of prostate cancer, PSA 32.6 EXAM: NUCLEAR MEDICINE WHOLE BODY BONE SCAN TECHNIQUE: Whole body anterior and posterior images were obtained approximately 3 hours after intravenous injection of radiopharmaceutical. RADIOPHARMACEUTICALS:  20.5 mCi Technetium-39m MDP  IV COMPARISON:  None Correlation: CT abdomen and pelvis 08/19/2018 FINDINGS: Minimal uptake of tracer at the knees and ankles, typically degenerative. No abnormal sites of osseous tracer accumulation are identified to suggest osseous metastatic disease. Expected urinary tract and soft tissue distribution of tracer. IMPRESSION: No scintigraphic evidence of osseous metastatic disease. Electronically Signed   By: Lavonia Dana M.D.   On: 08/20/2018 17:10      IMPRESSION/PLAN: 1. 62 y.o. gentleman with Stage T1c adenocarcinoma of the prostate with Gleason Score of 4+3, and PSA of 32.6. Today we reviewed the findings and workup thus far.  We discussed the natural history of prostate cancer.  The patient's T stage, Gleason's score, and PSA put him into the high risk group. We reviewed the the implications of T-stage, Gleason's Score, and PSA on decision-making and outcomes related to prostate cancer.  We discussed radiation treatment in the management of prostate cancer with regard to the logistics and delivery of external beam radiation treatment in combination with LT-ADT.  We compared and contrasted this option against prostatectomy.  We also detailed the role of ADT in the treatment of high risk prostate cancer and outlined the associated side effects that could be expected with this therapy  The patient focused most of his questions and interest in robotic-assisted laparoscopic radical prostatectomy.  We discussed some of the potential advantages of surgery including surgical staging, the availability of salvage radiotherapy to the prostatic fossa, and the confidence associated with immediate biochemical response.  We discussed some of the potential proven indications for postoperative radiotherapy including positive margins, extracapsular extension, and seminal vesicle involvement. We also talked about some of the other potential findings leading to a recommendation for radiotherapy including a non-zero  postoperative PSA and positive lymph nodes.   At the conclusion of our conversation, the patient elects to proceed as scheduled with robotic prostatectomy on 09/30/18.  We will share this information with Dr. Tresa Moore so that he may move forward with treatment planning. We enjoyed meeting with him today, and look forward to following his progress.  We would be happy to further participate in his care if clinically indicated following prostatectomy.  We spent 60 minutes face to face with the patient and  more than 50% of that time was spent in counseling and/or coordination of care.    Nicholos Johns, PA-C    Tyler Pita, MD  [facility] Direct Dial: [phone]  Fax: [phone] [email or web address]  Skype  LinkedIn   This document serves as a record of services personally performed by Tyler Pita, MD and Freeman Caldron, PA-C. It was created on their behalf by Wilburn Mylar, a trained medical scribe. The creation of this record is based on the scribe's personal observations and the provider's statements to them. This document has been checked and approved by the attending provider.

## 2018-09-16 NOTE — Progress Notes (Signed)
GU Location of Tumor / Histology: prostatic adenocarcinoma  If Prostate Cancer, Gleason Score is (4 + 3) and PSA is (32.6). Prostate volume: [place] had a PSA of 5.56 in 2012 but did not comply with urology referral. PSA in November 2014 was 15.02. Patient did not show for his prostate biopsy.   Biopsies of prostate (if applicable) revealed:    Past/Anticipated interventions by urology, if any: prostate biopsy, CT and bone scan, referral to radiation oncology, leaning toward surgery  Past/Anticipated interventions by medical oncology, if any: no  Weight changes, if any: no  Bowel/Bladder complaints, if any: IPSS 12. SHIM 15. Denies dysuria or hematuria. Denies urinary leakage or incontinence.   Nausea/Vomiting, if any: no  Pain issues, if any:  no  SAFETY ISSUES:  Prior radiation? no  Pacemaker/ICD? no  Possible current pregnancy? no  Is the patient on methotrexate? no  Current Complaints / other details:  62 year old male. Divorced. 4 sons. Father and both with history of cancer. Works as Print production planner for [employer].

## 2018-09-17 ENCOUNTER — Telehealth: Payer: Self-pay | Admitting: General Practice

## 2018-09-17 NOTE — Telephone Encounter (Signed)
[facility] Psychosocial Distress Screening Clinical Social Work  Clinical Social Work was referred by distress screening protocol.  The patient scored a 7 on the Psychosocial Distress Thermometer which indicates moderate distress. Clinical Social Worker contacted patient by phone to assess for distress and other psychosocial needs. Patient expressed understandable concern about current diagnosis and treatment plan.  Concerned about managing work responsibilities and treatment needs.  CSW explained resources available through [employer] and will mail information packet w contact information for CSW.  Encouraged patient to reach out as needed.    ONCBCN DISTRESS SCREENING 09/16/2018  Screening Type Initial Screening  Distress experienced in past week (1-10) 7  Practical problem type Work/school  Emotional problem type Adjusting to illness  Physician notified of physical symptoms Yes  Referral to clinical psychology No  Referral to clinical social work No  Referral to dietition No  Referral to financial advocate No  Referral to support programs No  Referral to palliative care No    Clinical Social Worker follow up needed: No.  If yes, follow up plan:  Beverely Pace, [place], LCSW Clinical Social Worker Phone:  [phone]

## 2018-09-22 ENCOUNTER — Encounter (HOSPITAL_COMMUNITY): Payer: Self-pay

## 2018-09-22 ENCOUNTER — Encounter (HOSPITAL_COMMUNITY)
Admission: RE | Admit: 2018-09-22 | Discharge: 2018-09-22 | Disposition: A | Discharge status: Home / Self Care | Payer: 59 | Source: Ambulatory Visit | Attending: Urology | Admitting: Urology

## 2018-09-22 ENCOUNTER — Other Ambulatory Visit: Payer: Self-pay

## 2018-09-22 DIAGNOSIS — Z01812 Encounter for preprocedural laboratory examination: Secondary | ICD-10-CM | POA: Diagnosis not present

## 2018-09-22 HISTORY — DX: Nausea with vomiting, unspecified: R11.2

## 2018-09-22 HISTORY — DX: Other specified postprocedural states: Z98.890

## 2018-09-22 LAB — CBC
HCT: 42.5 % (ref 39.0–52.0)
HEMOGLOBIN: 13.9 g/dL (ref 13.0–17.0)
MCH: 30.7 pg (ref 26.0–34.0)
MCHC: 32.7 g/dL (ref 30.0–36.0)
MCV: 93.8 fL (ref 80.0–100.0)
NRBC: 0 % (ref 0.0–0.2)
PLATELETS: 202 10*3/uL (ref 150–400)
RBC: 4.53 MIL/uL (ref 4.22–5.81)
RDW: 13.4 % (ref 11.5–15.5)
WBC: 6.6 10*3/uL (ref 4.0–10.5)

## 2018-09-22 NOTE — Progress Notes (Signed)
cmp 08-27-18 epic

## 2018-09-30 ENCOUNTER — Ambulatory Visit (HOSPITAL_COMMUNITY): Payer: 59 | Admitting: Anesthesiology

## 2018-09-30 ENCOUNTER — Encounter (HOSPITAL_COMMUNITY)
Admission: RE | Disposition: A | Discharge status: Home / Self Care | Payer: Self-pay | Source: Ambulatory Visit | Attending: Urology

## 2018-09-30 ENCOUNTER — Encounter (HOSPITAL_COMMUNITY): Payer: Self-pay | Admitting: *Deleted

## 2018-09-30 ENCOUNTER — Observation Stay (HOSPITAL_COMMUNITY)
Admission: RE | Admit: 2018-09-30 | Discharge: 2018-10-01 | Disposition: A | Discharge status: Home / Self Care | Payer: 59 | Source: Ambulatory Visit | Attending: Urology | Admitting: Urology

## 2018-09-30 DIAGNOSIS — C61 Malignant neoplasm of prostate: Secondary | ICD-10-CM | POA: Diagnosis not present

## 2018-09-30 DIAGNOSIS — F419 Anxiety disorder, unspecified: Secondary | ICD-10-CM | POA: Insufficient documentation

## 2018-09-30 DIAGNOSIS — R112 Nausea with vomiting, unspecified: Secondary | ICD-10-CM | POA: Insufficient documentation

## 2018-09-30 DIAGNOSIS — E782 Mixed hyperlipidemia: Secondary | ICD-10-CM | POA: Diagnosis not present

## 2018-09-30 DIAGNOSIS — C775 Secondary and unspecified malignant neoplasm of intrapelvic lymph nodes: Secondary | ICD-10-CM | POA: Diagnosis not present

## 2018-09-30 DIAGNOSIS — Z79899 Other long term (current) drug therapy: Secondary | ICD-10-CM | POA: Insufficient documentation

## 2018-09-30 DIAGNOSIS — E785 Hyperlipidemia, unspecified: Secondary | ICD-10-CM | POA: Insufficient documentation

## 2018-09-30 DIAGNOSIS — D509 Iron deficiency anemia, unspecified: Secondary | ICD-10-CM | POA: Diagnosis not present

## 2018-09-30 DIAGNOSIS — F329 Major depressive disorder, single episode, unspecified: Secondary | ICD-10-CM | POA: Diagnosis not present

## 2018-09-30 DIAGNOSIS — K219 Gastro-esophageal reflux disease without esophagitis: Secondary | ICD-10-CM | POA: Insufficient documentation

## 2018-09-30 HISTORY — PX: LYMPHADENECTOMY: SHX5960

## 2018-09-30 HISTORY — PX: ROBOT ASSISTED LAPAROSCOPIC RADICAL PROSTATECTOMY: SHX5141

## 2018-09-30 LAB — HEMOGLOBIN AND HEMATOCRIT, BLOOD
HEMATOCRIT: 40.3 % (ref 39.0–52.0)
Hemoglobin: 13.2 g/dL (ref 13.0–17.0)

## 2018-09-30 SURGERY — PROSTATECTOMY, RADICAL, ROBOT-ASSISTED, LAPAROSCOPIC
Anesthesia: General

## 2018-09-30 MED ORDER — CEFAZOLIN SODIUM-DEXTROSE 2-4 GM/100ML-% IV SOLN
2.0000 g | INTRAVENOUS | Status: AC
  Administered 2018-09-30: 2 g via INTRAVENOUS
  Filled 2018-09-30: qty 100

## 2018-09-30 MED ORDER — LACTATED RINGERS IR SOLN
Status: DC | PRN
  Administered 2018-09-30: 1000

## 2018-09-30 MED ORDER — PROMETHAZINE HCL 25 MG/ML IJ SOLN
6.2500 mg | INTRAMUSCULAR | Status: DC | PRN
  Administered 2018-09-30: 6.25 mg via INTRAVENOUS

## 2018-09-30 MED ORDER — HYDROMORPHONE HCL 1 MG/ML IJ SOLN
INTRAMUSCULAR | Status: AC
  Administered 2018-09-30: 0.5 mg via INTRAVENOUS
  Filled 2018-09-30: qty 1

## 2018-09-30 MED ORDER — PROMETHAZINE HCL 25 MG/ML IJ SOLN
INTRAMUSCULAR | Status: AC
  Filled 2018-09-30: qty 1

## 2018-09-30 MED ORDER — DEXTROSE-NACL 5-0.45 % IV SOLN
INTRAVENOUS | Status: DC
  Administered 2018-09-30: 19:00:00 via INTRAVENOUS

## 2018-09-30 MED ORDER — OXYCODONE HCL 5 MG PO TABS
5.0000 mg | ORAL_TABLET | ORAL | Status: DC | PRN
  Administered 2018-10-01: 5 mg via ORAL
  Filled 2018-09-30: qty 1

## 2018-09-30 MED ORDER — MEPERIDINE HCL 50 MG/ML IJ SOLN
INTRAMUSCULAR | Status: AC
  Filled 2018-09-30: qty 1

## 2018-09-30 MED ORDER — FENTANYL CITRATE (PF) 100 MCG/2ML IJ SOLN
INTRAMUSCULAR | Status: AC
  Filled 2018-09-30: qty 2

## 2018-09-30 MED ORDER — ONDANSETRON HCL 4 MG/2ML IJ SOLN
4.0000 mg | INTRAMUSCULAR | Status: DC | PRN
  Administered 2018-09-30 – 2018-10-01 (×2): 4 mg via INTRAVENOUS
  Filled 2018-09-30 (×3): qty 2

## 2018-09-30 MED ORDER — DEXAMETHASONE SODIUM PHOSPHATE 10 MG/ML IJ SOLN
INTRAMUSCULAR | Status: AC
  Filled 2018-09-30: qty 1

## 2018-09-30 MED ORDER — SODIUM CHLORIDE 0.9 % IV BOLUS
1000.0000 mL | Freq: Once | INTRAVENOUS | Status: AC
  Administered 2018-09-30: 1000 mL via INTRAVENOUS

## 2018-09-30 MED ORDER — HYDROMORPHONE HCL 1 MG/ML IJ SOLN
INTRAMUSCULAR | Status: AC
  Administered 2018-09-30: 0.25 mg via INTRAVENOUS
  Filled 2018-09-30: qty 1

## 2018-09-30 MED ORDER — SUGAMMADEX SODIUM 200 MG/2ML IV SOLN
INTRAVENOUS | Status: DC | PRN
  Administered 2018-09-30: 200 mg via INTRAVENOUS

## 2018-09-30 MED ORDER — SODIUM CHLORIDE (PF) 0.9 % IJ SOLN
INTRAMUSCULAR | Status: DC | PRN
  Administered 2018-09-30: 20 mL

## 2018-09-30 MED ORDER — ACETAMINOPHEN 500 MG PO TABS
1000.0000 mg | ORAL_TABLET | Freq: Four times a day (QID) | ORAL | Status: AC
  Administered 2018-09-30 – 2018-10-01 (×4): 1000 mg via ORAL
  Filled 2018-09-30 (×4): qty 2

## 2018-09-30 MED ORDER — ONDANSETRON HCL 4 MG/2ML IJ SOLN
INTRAMUSCULAR | Status: AC
  Filled 2018-09-30: qty 2

## 2018-09-30 MED ORDER — LACTATED RINGERS IV SOLN
INTRAVENOUS | Status: DC
  Administered 2018-09-30 (×2): via INTRAVENOUS

## 2018-09-30 MED ORDER — BUPIVACAINE LIPOSOME 1.3 % IJ SUSP
20.0000 mL | Freq: Once | INTRAMUSCULAR | Status: AC
  Administered 2018-09-30: 20 mL
  Filled 2018-09-30: qty 20

## 2018-09-30 MED ORDER — HYDROMORPHONE HCL 1 MG/ML IJ SOLN
0.2500 mg | INTRAMUSCULAR | Status: DC | PRN
  Administered 2018-09-30: 0.5 mg via INTRAVENOUS
  Administered 2018-09-30 (×2): 0.25 mg via INTRAVENOUS
  Administered 2018-09-30: 0.5 mg via INTRAVENOUS

## 2018-09-30 MED ORDER — DIPHENHYDRAMINE HCL 12.5 MG/5ML PO ELIX: 12.5000 mg | ORAL_SOLUTION | Freq: Four times a day (QID) | ORAL | Status: DC | PRN

## 2018-09-30 MED ORDER — SUGAMMADEX SODIUM 200 MG/2ML IV SOLN
INTRAVENOUS | Status: AC
  Filled 2018-09-30: qty 2

## 2018-09-30 MED ORDER — SULFAMETHOXAZOLE-TRIMETHOPRIM 800-160 MG PO TABS: 1.0000 | ORAL_TABLET | Freq: Two times a day (BID) | ORAL | 0 refills | Status: DC

## 2018-09-30 MED ORDER — ONDANSETRON HCL 4 MG/2ML IJ SOLN
INTRAMUSCULAR | Status: DC | PRN
  Administered 2018-09-30: 4 mg via INTRAVENOUS

## 2018-09-30 MED ORDER — MIDAZOLAM HCL 5 MG/5ML IJ SOLN
INTRAMUSCULAR | Status: DC | PRN
  Administered 2018-09-30: 2 mg via INTRAVENOUS

## 2018-09-30 MED ORDER — FENTANYL CITRATE (PF) 100 MCG/2ML IJ SOLN
INTRAMUSCULAR | Status: DC | PRN
  Administered 2018-09-30 (×2): 50 ug via INTRAVENOUS
  Administered 2018-09-30: 100 ug via INTRAVENOUS

## 2018-09-30 MED ORDER — HYDROMORPHONE HCL 1 MG/ML IJ SOLN
0.5000 mg | INTRAMUSCULAR | Status: DC | PRN
  Administered 2018-09-30 – 2018-10-01 (×4): 1 mg via INTRAVENOUS
  Filled 2018-09-30 (×4): qty 1

## 2018-09-30 MED ORDER — PROPOFOL 10 MG/ML IV BOLUS
INTRAVENOUS | Status: AC
  Filled 2018-09-30: qty 20

## 2018-09-30 MED ORDER — LIDOCAINE 2% (20 MG/ML) 5 ML SYRINGE
INTRAMUSCULAR | Status: DC | PRN
  Administered 2018-09-30: 10 mg via INTRAVENOUS

## 2018-09-30 MED ORDER — PROPOFOL 10 MG/ML IV BOLUS
INTRAVENOUS | Status: DC | PRN
  Administered 2018-09-30: 200 mg via INTRAVENOUS

## 2018-09-30 MED ORDER — ROCURONIUM BROMIDE 100 MG/10ML IV SOLN
INTRAVENOUS | Status: AC
  Filled 2018-09-30: qty 1

## 2018-09-30 MED ORDER — MIDAZOLAM HCL 2 MG/2ML IJ SOLN
INTRAMUSCULAR | Status: AC
  Filled 2018-09-30: qty 2

## 2018-09-30 MED ORDER — PHENYLEPHRINE 40 MCG/ML (10ML) SYRINGE FOR IV PUSH (FOR BLOOD PRESSURE SUPPORT)
PREFILLED_SYRINGE | INTRAVENOUS | Status: AC
  Filled 2018-09-30: qty 10

## 2018-09-30 MED ORDER — ATORVASTATIN CALCIUM 40 MG PO TABS
80.0000 mg | ORAL_TABLET | Freq: Every day | ORAL | Status: DC
  Administered 2018-10-01: 80 mg via ORAL
  Filled 2018-09-30: qty 2

## 2018-09-30 MED ORDER — PHENYLEPHRINE 40 MCG/ML (10ML) SYRINGE FOR IV PUSH (FOR BLOOD PRESSURE SUPPORT)
PREFILLED_SYRINGE | INTRAVENOUS | Status: DC | PRN
  Administered 2018-09-30: 80 ug via INTRAVENOUS

## 2018-09-30 MED ORDER — SODIUM CHLORIDE (PF) 0.9 % IJ SOLN
INTRAMUSCULAR | Status: AC
  Filled 2018-09-30: qty 20

## 2018-09-30 MED ORDER — DEXAMETHASONE SODIUM PHOSPHATE 10 MG/ML IJ SOLN
INTRAMUSCULAR | Status: DC | PRN
  Administered 2018-09-30: 10 mg via INTRAVENOUS

## 2018-09-30 MED ORDER — LACTATED RINGERS IV SOLN: INTRAVENOUS | Status: DC

## 2018-09-30 MED ORDER — HYDROCODONE-ACETAMINOPHEN 5-325 MG PO TABS: 1.0000 | ORAL_TABLET | Freq: Four times a day (QID) | ORAL | 0 refills | Status: DC | PRN

## 2018-09-30 MED ORDER — MEPERIDINE HCL 50 MG/ML IJ SOLN: 6.2500 mg | INTRAMUSCULAR | Status: DC | PRN

## 2018-09-30 MED ORDER — FAMOTIDINE 20 MG PO TABS
10.0000 mg | ORAL_TABLET | Freq: Every day | ORAL | Status: DC
  Administered 2018-10-01: 10 mg via ORAL
  Filled 2018-09-30: qty 1

## 2018-09-30 MED ORDER — BELLADONNA ALKALOIDS-OPIUM 16.2-60 MG RE SUPP: 1.0000 | Freq: Four times a day (QID) | RECTAL | Status: DC | PRN

## 2018-09-30 MED ORDER — LIDOCAINE 2% (20 MG/ML) 5 ML SYRINGE
INTRAMUSCULAR | Status: AC
  Filled 2018-09-30: qty 5

## 2018-09-30 MED ORDER — DIPHENHYDRAMINE HCL 50 MG/ML IJ SOLN: 12.5000 mg | Freq: Four times a day (QID) | INTRAMUSCULAR | Status: DC | PRN

## 2018-09-30 MED ORDER — ROCURONIUM BROMIDE 10 MG/ML (PF) SYRINGE
PREFILLED_SYRINGE | INTRAVENOUS | Status: DC | PRN
  Administered 2018-09-30: 60 mg via INTRAVENOUS
  Administered 2018-09-30 (×2): 20 mg via INTRAVENOUS

## 2018-09-30 SURGICAL SUPPLY — 64 items
ADH SKN CLS APL DERMABOND .7 (GAUZE/BANDAGES/DRESSINGS) ×2
APL SWBSTK 6 STRL LF DISP (MISCELLANEOUS) ×2
APPLICATOR COTTON TIP 6 STRL (MISCELLANEOUS) ×2 IMPLANT
APPLICATOR COTTON TIP 6IN STRL (MISCELLANEOUS) ×4
CATH FOLEY 2WAY SLVR 18FR 30CC (CATHETERS) ×4 IMPLANT
CATH TIEMANN FOLEY 18FR 5CC (CATHETERS) ×4 IMPLANT
CHLORAPREP W/TINT 26ML (MISCELLANEOUS) ×4 IMPLANT
CLIP VESOLOCK LG 6/CT PURPLE (CLIP) ×10 IMPLANT
CONT SPEC 4OZ CLIKSEAL STRL BL (MISCELLANEOUS) ×4 IMPLANT
COVER TIP SHEARS 8 DVNC (MISCELLANEOUS) ×2 IMPLANT
COVER TIP SHEARS 8MM DA VINCI (MISCELLANEOUS) ×2
COVER WAND RF STERILE (DRAPES) ×2 IMPLANT
CUTTER ECHEON FLEX ENDO 45 340 (ENDOMECHANICALS) ×4 IMPLANT
DECANTER SPIKE VIAL GLASS SM (MISCELLANEOUS) ×4 IMPLANT
DERMABOND ADVANCED (GAUZE/BANDAGES/DRESSINGS) ×2
DERMABOND ADVANCED .7 DNX12 (GAUZE/BANDAGES/DRESSINGS) ×2 IMPLANT
DRAPE ARM DVNC X/XI (DISPOSABLE) ×8 IMPLANT
DRAPE COLUMN DVNC XI (DISPOSABLE) ×2 IMPLANT
DRAPE DA VINCI XI ARM (DISPOSABLE) ×8
DRAPE DA VINCI XI COLUMN (DISPOSABLE) ×2
DRAPE SURG IRRIG POUCH 19X23 (DRAPES) ×4 IMPLANT
DRSG TEGADERM 4X4.75 (GAUZE/BANDAGES/DRESSINGS) ×4 IMPLANT
ELECT PENCIL ROCKER SW 15FT (MISCELLANEOUS) ×4 IMPLANT
ELECT REM PT RETURN 15FT ADLT (MISCELLANEOUS) ×4 IMPLANT
GAUZE SPONGE 2X2 8PLY STRL LF (GAUZE/BANDAGES/DRESSINGS) IMPLANT
GLOVE BIO SURGEON STRL SZ 6.5 (GLOVE) ×3 IMPLANT
GLOVE BIO SURGEONS STRL SZ 6.5 (GLOVE) ×1
GLOVE BIOGEL M STRL SZ7.5 (GLOVE) ×8 IMPLANT
GLOVE BIOGEL PI IND STRL 7.5 (GLOVE) ×2 IMPLANT
GLOVE BIOGEL PI INDICATOR 7.5 (GLOVE) ×2
GOWN STRL REUS W/TWL LRG LVL3 (GOWN DISPOSABLE) ×12 IMPLANT
HOLDER FOLEY CATH W/STRAP (MISCELLANEOUS) ×4 IMPLANT
IRRIG SUCT STRYKERFLOW 2 WTIP (MISCELLANEOUS) ×4
IRRIGATION SUCT STRKRFLW 2 WTP (MISCELLANEOUS) ×2 IMPLANT
KIT PROCEDURE DA VINCI SI (MISCELLANEOUS) ×2
KIT PROCEDURE DVNC SI (MISCELLANEOUS) ×2 IMPLANT
NDL INSUFFLATION 14GA 120MM (NEEDLE) ×2 IMPLANT
NDL SPNL 22GX7 QUINCKE BK (NEEDLE) ×2 IMPLANT
NEEDLE INSUFFLATION 14GA 120MM (NEEDLE) ×4 IMPLANT
NEEDLE SPNL 22GX7 QUINCKE BK (NEEDLE) ×4 IMPLANT
PACK ROBOT UROLOGY CUSTOM (CUSTOM PROCEDURE TRAY) ×4 IMPLANT
PAD POSITIONING PINK XL (MISCELLANEOUS) ×4 IMPLANT
PORT ACCESS TROCAR AIRSEAL 12 (TROCAR) ×2 IMPLANT
PORT ACCESS TROCAR AIRSEAL 5M (TROCAR) ×2
RELOAD STAPLE 45 4.1 GRN THCK (STAPLE) ×2 IMPLANT
SEAL CANN UNIV 5-8 DVNC XI (MISCELLANEOUS) ×8 IMPLANT
SEAL XI 5MM-8MM UNIVERSAL (MISCELLANEOUS) ×8
SET TRI-LUMEN FLTR TB AIRSEAL (TUBING) ×4 IMPLANT
SOLUTION ELECTROLUBE (MISCELLANEOUS) ×4 IMPLANT
SPONGE GAUZE 2X2 STER 10/PKG (GAUZE/BANDAGES/DRESSINGS)
SPONGE LAP 4X18 RFD (DISPOSABLE) ×4 IMPLANT
STAPLE RELOAD 45 GRN (STAPLE) ×2 IMPLANT
STAPLE RELOAD 45MM GREEN (STAPLE) ×4
SUT ETHILON 3 0 PS 1 (SUTURE) ×4 IMPLANT
SUT MNCRL AB 4-0 PS2 18 (SUTURE) ×8 IMPLANT
SUT PDS AB 1 CT1 27 (SUTURE) ×8 IMPLANT
SUT VIC AB 2-0 SH 27 (SUTURE) ×4
SUT VIC AB 2-0 SH 27X BRD (SUTURE) ×2 IMPLANT
SUT VICRYL 0 UR6 27IN ABS (SUTURE) ×4 IMPLANT
SUT VLOC BARB 180 ABS3/0GR12 (SUTURE) ×12
SUTURE VLOC BRB 180 ABS3/0GR12 (SUTURE) ×6 IMPLANT
SYR 27GX1/2 1ML LL SAFETY (SYRINGE) ×4 IMPLANT
TOWEL OR NON WOVEN STRL DISP B (DISPOSABLE) ×4 IMPLANT
WATER STERILE IRR 1000ML POUR (IV SOLUTION) ×4 IMPLANT

## 2018-09-30 NOTE — Discharge Instructions (Signed)

## 2018-09-30 NOTE — Anesthesia Postprocedure Evaluation (Signed)
Anesthesia Post Note  Patient: Steven Roy  Procedure(s) Performed: XI ROBOTIC ASSISTED LAPAROSCOPIC RADICAL PROSTATECTOMY (N/A ) LYMPHADENECTOMY (Bilateral )     Patient location during evaluation: PACU Anesthesia Type: General Level of consciousness: awake and alert Pain management: pain level controlled Vital Signs Assessment: post-procedure vital signs reviewed and stable Respiratory status: spontaneous breathing, nonlabored ventilation, respiratory function stable and patient connected to nasal cannula oxygen Cardiovascular status: blood pressure returned to baseline and stable Postop Assessment: no apparent nausea or vomiting Anesthetic complications: no    Last Vitals:  Vitals:   09/30/18 1815 09/30/18 1852  BP: (!) 142/81 129/73  Pulse: 88 85  Resp: (!) 21 18  Temp: 36.6 C 36.6 C  SpO2: 99% 97%                   Effie Berkshire

## 2018-09-30 NOTE — Anesthesia Procedure Notes (Signed)
Procedure Name: Intubation Date/Time: 09/30/2018 2:29 PM Performed by: Lind Covert, CRNA Pre-anesthesia Checklist: Patient identified, Emergency Drugs available, Suction available, Patient being monitored and Timeout performed Patient Re-evaluated:Patient Re-evaluated prior to induction Oxygen Delivery Method: Circle system utilized Preoxygenation: Pre-oxygenation with 100% oxygen Induction Type: IV induction Ventilation: Mask ventilation without difficulty Laryngoscope Size: Mac and 4 Grade View: Grade I Tube type: Oral Tube size: 7.5 mm Number of attempts: 1 Airway Equipment and Method: Stylet Placement Confirmation: ETT inserted through vocal cords under direct vision,  positive ETCO2 and breath sounds checked- equal and bilateral Secured at: 22 cm Tube secured with: Tape Dental Injury: Teeth and Oropharynx as per pre-operative assessment

## 2018-09-30 NOTE — H&P (Signed)
Steven Roy is an 62 y.o. male.    Chief Complaint: Pre-OP Prostatectomy  HPI:   1 - High Risk Prostate Cancer -4/12 cores up to 60% Grade 3 (Gleason 4+3=7) adenocarcinoma LLB, LLM, LLA, LMB on eval PSA rise to 32.6. TRUS BX 41 ML, no median lobe. CT and Bone Scan clincally localized.   PMH sig for HLD. No CV disease. No strong blood thinners. 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    Today "Steven Roy" is seen to proceed with robotic prostatectomy with ICG sentinel + template node dissection.    Past Medical History:  Diagnosis Date  . Anxiety    denies  . Depression    denies  . GERD (gastroesophageal reflux disease)   . PONV (postoperative nausea and vomiting)   . Prostate cancer [facility])     Past Surgical History:  Procedure Laterality Date  . ANKLE ARTHROSCOPY    . HAND SURGERY    . PROSTATE BIOPSY    . XI ROBOTIC ASSISTED SIMPLE PROSTATECTOMY     Dr. Tresa Moore 09-30-18    Family History  Problem Relation Age of Onset  . Cancer Father        unknown  . Prostate cancer Brother        surgery followed by radiation  . Breast cancer Neg Hx   . Colon cancer Neg Hx   . Pancreatic cancer Neg Hx    Social History:  reports that he has never smoked. He has never used smokeless tobacco. He reports that he drinks alcohol. He reports that he does not use drugs.  Allergies: No Known Allergies  No medications prior to admission.    No results found for this or any previous visit (from the past 48 hour(s)). No results found.  Review of Systems  Constitutional: Negative.  Negative for chills and fever.  HENT: Negative.   Eyes: Negative.   Respiratory: Negative.   Cardiovascular: Negative.   Gastrointestinal: Negative.   Genitourinary: Negative.   Musculoskeletal: Negative.   Skin: Negative.   Neurological: Negative.   Endo/Heme/Allergies: Negative.   Psychiatric/Behavioral: Negative.     There were no vitals taken for this visit. Physical Exam  Constitutional:  He appears well-developed.  HENT:  Head: Normocephalic.  Eyes: Pupils are equal, round, and reactive to light.  Neck: Normal range of motion.  Cardiovascular: Normal rate.  Respiratory: Effort normal.  GI: Soft.  Genitourinary:  Genitourinary Comments: No CVAT  Musculoskeletal: Normal range of motion.  Neurological: He is alert.  Skin: Skin is warm.  Psychiatric: He has a normal mood and affect.     Assessment/Plan   Proceed as planned with robotic prostatectomy with node dissection. Risks, benefits, alternatives, expected peri-op course dicussed previously and reiterated today.    Alexis Frock, MD 09/30/2018, 6:51 AM

## 2018-09-30 NOTE — Transfer of Care (Signed)
Immediate Anesthesia Transfer of Care Note  Patient: Steven Roy  Procedure(s) Performed: XI ROBOTIC ASSISTED LAPAROSCOPIC RADICAL PROSTATECTOMY (N/A ) LYMPHADENECTOMY (Bilateral )  Patient Location: PACU  Anesthesia Type:General  Level of Consciousness: sedated  Airway & Oxygen Therapy: Patient Spontanous Breathing and Patient connected to face mask oxygen  Post-op Assessment: Report given to RN and Post -op Vital signs reviewed and stable  Post vital signs: Reviewed and stable  Last Vitals:  Vitals Value Taken Time  BP    Temp    Pulse 97 09/30/2018  5:07 PM  Resp 17 09/30/2018  5:07 PM  SpO2 98 % 09/30/2018  5:07 PM  Vitals shown include unvalidated device data.  Last Pain:  Vitals:   09/30/18 1220  TempSrc: Oral         Complications: No apparent anesthesia complications

## 2018-09-30 NOTE — Anesthesia Preprocedure Evaluation (Addendum)
Anesthesia Evaluation  Patient identified by MRN, date of birth, ID band Patient awake    Reviewed: Allergy & Precautions, NPO status , Patient's Chart, lab work & pertinent test results  History of Anesthesia Complications (+) PONV  Airway Mallampati: III  TM Distance: >3 FB Neck ROM: Full    Dental  (+) Teeth Intact, Dental Advisory Given, Chipped,    Pulmonary    breath sounds clear to auscultation       Cardiovascular negative cardio ROS   Rhythm:Regular Rate:Normal     Neuro/Psych Anxiety Depression    GI/Hepatic Neg liver ROS, GERD  Medicated,  Endo/Other  negative endocrine ROS  Renal/GU negative Renal ROS     Musculoskeletal negative musculoskeletal ROS (+)   Abdominal Normal abdominal exam  (+)   Peds  Hematology negative hematology ROS (+)   Anesthesia Other Findings   Reproductive/Obstetrics                            Anesthesia Physical Anesthesia Plan  ASA: II  Anesthesia Plan: General   Post-op Pain Management:    Induction: Intravenous  PONV Risk Score and Plan: 4 or greater and Ondansetron, Dexamethasone, Midazolam, Scopolamine patch - Pre-op and Treatment may vary due to age or medical condition  Airway Management Planned: Oral ETT  Additional Equipment: None  Intra-op Plan:   Post-operative Plan: Extubation in OR  Informed Consent:   Plan Discussed with: CRNA  Anesthesia Plan Comments:         Anesthesia Quick Evaluation

## 2018-09-30 NOTE — Brief Op Note (Signed)
09/30/2018  4:50 PM  PATIENT:  Steven Roy  62 y.o. male  PRE-OPERATIVE DIAGNOSIS:  PROSTATE CANCER  POST-OPERATIVE DIAGNOSIS:  PROSTATE CANCER  PROCEDURE:  Procedure(s) with comments: XI ROBOTIC ASSISTED LAPAROSCOPIC RADICAL PROSTATECTOMY (N/A) - 3 HRS LYMPHADENECTOMY (Bilateral)  SURGEON:  Surgeon(s) and Role:    * Alexis Frock, MD - Primary  PHYSICIAN ASSISTANT:   ASSISTANTS: Debbrah Alar PA   ANESTHESIA:   local and general  EBL:  150 mL   BLOOD ADMINISTERED:none   DRAINS: JP to bulb   LOCAL MEDICATIONS USED:  MARCAINE     SPECIMEN:  Source of Specimen:  1 - pelvic lymph nodes; 2 - prostatectomy  DISPOSITION OF SPECIMEN:  PATHOLOGY  COUNTS:  YES  TOURNIQUET:  * No tourniquets in log *  DICTATION: .Other Dictation: Dictation Number [id number]  PLAN OF CARE: Admit to inpatient   PATIENT DISPOSITION:  PACU - hemodynamically stable.   Delay start of Pharmacological VTE agent (>24hrs) due to surgical blood loss or risk of bleeding: yes

## 2018-10-01 ENCOUNTER — Encounter (HOSPITAL_COMMUNITY): Payer: Self-pay | Admitting: Urology

## 2018-10-01 DIAGNOSIS — K219 Gastro-esophageal reflux disease without esophagitis: Secondary | ICD-10-CM | POA: Diagnosis not present

## 2018-10-01 DIAGNOSIS — F329 Major depressive disorder, single episode, unspecified: Secondary | ICD-10-CM | POA: Diagnosis not present

## 2018-10-01 DIAGNOSIS — C61 Malignant neoplasm of prostate: Secondary | ICD-10-CM | POA: Diagnosis not present

## 2018-10-01 DIAGNOSIS — R112 Nausea with vomiting, unspecified: Secondary | ICD-10-CM | POA: Diagnosis not present

## 2018-10-01 DIAGNOSIS — C775 Secondary and unspecified malignant neoplasm of intrapelvic lymph nodes: Secondary | ICD-10-CM | POA: Diagnosis not present

## 2018-10-01 DIAGNOSIS — F419 Anxiety disorder, unspecified: Secondary | ICD-10-CM | POA: Diagnosis not present

## 2018-10-01 DIAGNOSIS — Z79899 Other long term (current) drug therapy: Secondary | ICD-10-CM | POA: Diagnosis not present

## 2018-10-01 DIAGNOSIS — E785 Hyperlipidemia, unspecified: Secondary | ICD-10-CM | POA: Diagnosis not present

## 2018-10-01 LAB — BASIC METABOLIC PANEL
Anion gap: 9 (ref 5–15)
BUN: 20 mg/dL (ref 8–23)
CALCIUM: 8.1 mg/dL — AB (ref 8.9–10.3)
CHLORIDE: 103 mmol/L (ref 98–111)
CO2: 23 mmol/L (ref 22–32)
CREATININE: 0.83 mg/dL (ref 0.61–1.24)
GFR calc Af Amer: >60 mL/min (ref >60)
GFR calc non Af Amer: >60 mL/min (ref >60)
Glucose, Bld: 184 mg/dL — ABNORMAL HIGH (ref 70–99)
Potassium: 4 mmol/L (ref 3.5–5.1)
Sodium: 135 mmol/L (ref 135–145)

## 2018-10-01 LAB — HEMOGLOBIN AND HEMATOCRIT, BLOOD
HEMATOCRIT: 37.6 % — AB (ref 39.0–52.0)
Hemoglobin: 12.7 g/dL — ABNORMAL LOW (ref 13.0–17.0)

## 2018-10-01 MED ORDER — SENNOSIDES-DOCUSATE SODIUM 8.6-50 MG PO TABS: 1.0000 | ORAL_TABLET | Freq: Two times a day (BID) | ORAL | 0 refills | Status: DC

## 2018-10-01 MED FILL — HYDROCODON-APAP 5-325: 5-325 | 3 days supply | Qty: 30 | Fill #0

## 2018-10-01 MED FILL — SULFAMETHOXAZOLE-TMP DS TAB: 800-160 | 3 days supply | Qty: 6 | Fill #0

## 2018-10-01 MED FILL — ONDANSETRON HCL 8 MG TABLET: 8 | 10 days supply | Qty: 30 | Fill #0

## 2018-10-01 NOTE — Op Note (Signed)
Steven Roy, [facility] MEDICAL RECORD WU:[id number] ACCOUNT [id number] DATE OF BIRTH:23-Sep-1956 FACILITY: [facility] LOCATION: [facility] PHYSICIAN:Yarisa Lynam Tresa Moore, MD  OPERATIVE REPORT  DATE OF PROCEDURE:  09/30/2018  PREOPERATIVE DIAGNOSIS:  Prostate cancer.  PROCEDURE PERFORMED:  Robotic-assisted laparoscopic radical prostatectomy with bilateral pelvic lymphadenectomy.  SURGEON:  Alexis Frock, MD  ASSISTANT:  Debbrah Alar, PA  ESTIMATED BLOOD LOSS:  150 mL  DRAINS:   1.  Jackson-Pratt drain to bulb suction.   2.  Foley catheter to straight drain.  FINDINGS: 1.  Very wide prostate with some inflammation posteriorly. 2.  A single dominant sentinel node left obturator group.  SPECIMENS: 1.  Radical prostatectomy. 2.  Right external iliac lymph nodes. 3.  Right upper lymph nodes. 4.  Left obturator lymph nodes, sentinel. 5.  Left external iliac lymph nodes.  INDICATIONS:  The patient is a very pleasant 62 year old man who was found on workup of elevated and rising PSA to have adenocarcinoma of the prostate.  Staging revealed localized disease.  Options were discussed for management including surveillance  protocols versus ablative therapy for surgical extirpation and he wished to proceed with radical prostatectomy.  Informed consent was obtained and placed in medical record.  DESCRIPTION OF PROCEDURE:  The patient was identified.  Procedure identified as radical prostatectomy was confirmed.  Procedure timeout was performed.  Intravenous antibiotics administered.  General endotracheal anesthesia induced.  The patient was  placed into a low lithotomy position, sterile field was created by prepping the patient's penis, perineum and proximal thighs using iodine and his infra-xiphoid abdomen utilizing chlorhexidine gluconate.  He was further fastened to the OR table using  3-inch tape over foam padding across the supraxiphoid chest.  A test of steep Trendelenburg positioning was performed.   He was found to be suitably positioned.  Foley catheter was placed free to straight drain, and a high-flow, low-pressure  pneumoperitoneum was obtained using Veress technique in the supraumbilical midline having passed the aspiration and drop test.  An 8 mm robotic camera port was then placed in the same location.  Laparoscopic examination of the peritoneal cavity revealed  no significant adhesions and no visceral injury.  Distal ports in place as follows:  Right paramedian 8 mm robotic port, far lateral 12 mm AirSeal assist port, right paramedian 5 mm suction port, left paramedian 8 mm robotic port, left far lateral 8 mm  robotic port.  Robot was docked and passed the electronic checks.  Initial attention was directed at developing the space of Retzius.  Incision was made lateral to the right medial umbilical ligament from the midline towards the area of the internal ring  coursing along the iliac vessels towards the area of the right ureter.  The right bladder wall was swept away from the lateral pelvic sidewall towards the area of the endopelvic fascia on the right side.  A mirror image dissection was performed on the  left side and anterior attachments were taken down using cautery scissors to expose the anterior base of the prostate, which was defatted to better demarcate the bladder neck and prostate junction.  Next, 0.2 mL of an indocyanine green dye was injected  into each lobe of the prostate using a percutaneously placed robotically guided spinal needle with intervening dye suction to prevent dye spillage, which did not occur.  Next, the endopelvic fascia was carefully swept away from the lateral aspect of the  prostate and base to apex orientation.  This exposed the dorsal venous complex was carefully controlled using vascular  stapler, taking exquisite care to avoid membranous urethral injury which did not occur.  Then, approximately 10 minutes post-dye  injection the pelvis was inspected under  near infrared fluorescence light.  Sentinel lymph angiography revealed single dominant lymph node in the left obturator group otherwise unremarkable.  As such, template lymphadenectomy was performed on the right side.  First, to the right external group with the boundaries being right  external iliac vein, artery pelvic sidewall, iliac bifurcation.  Lymphostasis was achieved with cold clips.  Next, the right obturator group was dissected free with the boundaries being right external iliac vein, pelvic sidewall, obturator nerve.  The  obturator nerve was inspected following these maneuvers and found to be uninjured.  A mirror image lymphadenectomy was performed on the left side on the left external group and left obturator groups respectively.  Notably, the left obturator group did  contain a single dominant sentinel lymph node.  It was labeled as such.  The left obturator nerve was also inspected and found to be uninjured.  Attention was directed at bladder neck dissection.  The bladder neck dissection was made more narrow in  caliber by performing a lateral release on each side and the bladder neck was then carefully separated from the base of the prostate in anterior, posterior direction that appeared to be a rim of circular muscle fibers each plane of dissection.  Posterior  dissection was performed by incising approximately 7 mm inferior posterior lip of the prostate entering the plane of Denonvilliers.  This plane was somewhat inflamed and adherent, but without obvious localized infection.  Bilateral vas deferens were  encountered, dissected for approximately 4 cm, ligated on gentle superior traction.  Bilateral seminal vesicles were dissected to the tip and placed on gentle superior traction.  Dissection proceeded within the plane of Denonvilliers inferiorly towards  the apex of the prostate.  This exposed the vascular pedicles on each side.  A purposeful narrow dissection was performed on the right  side, performing partial nerve sparing using a sequential clipping technique in a base to apex orientation.  A  purposeful wide dissection was performed on the left side, again using a sequential clipping technique in a base to apex orientation.  Final apical dissection was performed in the anterior plane by placing the prostate on superior traction and the  membranous urethra coldly transected keeping what appeared to be an adequate membranous urethral stump.  This completely freed the prostatectomy specimen, which was placed into an EndoCatch bag for later retrieval.  Digital rectal exam was then performed  using indicator glove and laparoscopic vision.  No evidence of rectal violation was noted.  Next, posterior dissection was performed using a single 3-0 V-Loc suture reapproximating the posterior urethral plate to the posterior bladder neck, bringing the  structures into tension free apposition.  Mucosa-to-mucosa anastomosis was then performed using double armed 3-0 V-Loc suture from the 6 o'clock to 12 o'clock position which resulted in excellent mucosal apposition circumferentially and a Foley catheter  was then placed which irrigated quantitatively.  All sponge, needle counts were correct.  Hemostasis appeared excellent.  A closed suction drain was brought out the previous left lateral most robotic port site near the peritoneal cavity.  The previous  right lateral most assistant port site was closed with fascia using interrupted suture passer and 0 Vicryl.  Robot was then undocked.  Specimen was retrieved by extending the previous camera port site superiorly for a distance of approximately 4 cm,  removing the  prostatectomy specimen setting aside for pathology.  The site was then closed with fascia using figure-of-eight PDS x4 followed by reapproximation of Scarpa's with a running Vicryl.  All incision sites were infiltrated with dilute lipolyzed  Marcaine and closed with skin using subcuticular  Monocryl and Dermabond.  Procedure then terminated.  The patient tolerated the procedure well.  No immediate perioperative complications.  The patient taken to the postanesthesia care in stable condition.  Please note, first assistant Debbrah Alar was crucial for all portions of the procedure today.  She provided invaluable retraction, specimen manipulation, vascular clipping, vascular stapling and first assistance.  TN/NUANCE  D:09/30/2018 T:10/01/2018 JOB:[id number]

## 2018-10-01 NOTE — Plan of Care (Signed)
Discharge instructions reviewed with patient and son, questions answered, verbalized understanding.  Instructed patient on transition from bedside bag for catheter to leg bag and back.  Patient transported to main entrance to be taken home by son.

## 2018-10-01 NOTE — Discharge Summary (Signed)
Physician Discharge Summary  Patient ID: Steven Roy MRN: [id number] DOB/AGE: January 28, 1956 62 y.o.  Admit date: 09/30/2018 Discharge date: 10/01/2018  Admission Diagnoses: Prostate Cancer  Discharge Diagnoses:  Active Problems:   Prostate cancer [facility])   Discharged Condition: good  Hospital Course: Pt underwent robotic prostatectomy with node dissection on 09/30/18, the day of admission, without acute complication. He was admitted to the 4th floor Urology service post-op. By the afternoon of POD 1 he is ambulatory, tolerating PO intake, pain controlled on PO meds, and felt to be adequate for discharge. Hfb >12, Cr <1.5. JP removed as output minimal. Final pathology pending at discharge.   Consults: None  Significant Diagnostic Studies: labs: as per above  Treatments: surgery: as per above.   Discharge Exam: Blood pressure 139/83, pulse 99, temperature 98.5 F (36.9 C), temperature source Oral, resp. rate 20, height 6\' 2"  (1.88 m), weight 94.8 kg, SpO2 99 %. General appearance: alert, cooperative and appears stated age Eyes: negative Nose: Nares normal. Septum midline. Mucosa normal. No drainage or sinus tenderness. Throat: lips, mucosa, and tongue normal; teeth and gums normal Neck: supple, symmetrical, trachea midline Back: symmetric, no curvature. ROM normal. No CVA tenderness. Resp: non-labored on room air.  Cardio: Nl rate GI: soft, non-tender; bowel sounds normal; no masses,  no organomegaly Male genitalia: normal, foley wtih medium yellow urine that is non-foul.  Extremities: extremities normal, atraumatic, no cyanosis or edema Skin: Skin color, texture, turgor normal. No rashes or lesions Lymph nodes: Cervical, supraclavicular, and axillary nodes normal. Neurologic: Grossly normal Incision/Wound: recent port and extraction sites c/d/i. JP removed and dry dressing applied, output minimal and non-foul.   Disposition: Discharge disposition: 01-Home or Self  Care        Allergies as of 10/01/2018   No Known Allergies     Medication List    STOP taking these medications   MULTIVITAMIN PO     TAKE these medications   atorvastatin 80 MG tablet Commonly known as:  LIPITOR Take 80 mg by mouth daily.   HYDROcodone-acetaminophen 5-325 MG tablet Commonly known as:  NORCO/VICODIN Take 1-2 tablets by mouth every 6 (six) hours as needed for moderate pain.   IRON PO Take 1 tablet by mouth daily.   ranitidine 150 MG tablet Commonly known as:  ZANTAC Take 150 mg by mouth 2 (two) times daily.   senna-docusate 8.6-50 MG tablet Commonly known as:  Senokot-S Take 1 tablet by mouth 2 (two) times daily. While taking strong pain meds to prevent constipation.   sulfamethoxazole-trimethoprim 800-160 MG tablet Commonly known as:  BACTRIM DS,SEPTRA DS Take 1 tablet by mouth 2 (two) times daily. Start the day prior to foley removal appointment      Follow-up Information    Alexis Frock, MD On 10/12/2018.   Specialty:  Urology Why:  at 8:30 AM for MD visit, path review, and catheter removal Contact information: [place] [address] [phone]           Signed: Alexis Frock 10/01/2018, 1:37 PM

## 2018-10-01 NOTE — Progress Notes (Signed)
Pt ambulated down the hall around 90 ft, pt tolerated well.

## 2018-10-12 MED FILL — Lactated Ringer's for Irrigation: Qty: 1000 | Status: AC

## 2018-10-14 ENCOUNTER — Inpatient Hospital Stay (HOSPITAL_COMMUNITY): Payer: 59

## 2018-10-14 ENCOUNTER — Emergency Department (HOSPITAL_COMMUNITY): Payer: 59

## 2018-10-14 ENCOUNTER — Inpatient Hospital Stay (HOSPITAL_COMMUNITY)
Admission: EM | Admit: 2018-10-14 | Discharge: 2018-10-18 | DRG: 175 | Disposition: A | Discharge status: Home / Self Care | Payer: 59 | Attending: Internal Medicine | Admitting: Internal Medicine

## 2018-10-14 ENCOUNTER — Other Ambulatory Visit: Payer: Self-pay

## 2018-10-14 ENCOUNTER — Encounter (HOSPITAL_COMMUNITY): Payer: Self-pay | Admitting: *Deleted

## 2018-10-14 DIAGNOSIS — I2609 Other pulmonary embolism with acute cor pulmonale: Principal | ICD-10-CM | POA: Diagnosis present

## 2018-10-14 DIAGNOSIS — R072 Precordial pain: Secondary | ICD-10-CM

## 2018-10-14 DIAGNOSIS — C61 Malignant neoplasm of prostate: Secondary | ICD-10-CM | POA: Diagnosis present

## 2018-10-14 DIAGNOSIS — J9601 Acute respiratory failure with hypoxia: Secondary | ICD-10-CM | POA: Diagnosis present

## 2018-10-14 DIAGNOSIS — Z79899 Other long term (current) drug therapy: Secondary | ICD-10-CM | POA: Diagnosis not present

## 2018-10-14 DIAGNOSIS — R55 Syncope and collapse: Secondary | ICD-10-CM | POA: Diagnosis not present

## 2018-10-14 DIAGNOSIS — R000 Tachycardia, unspecified: Secondary | ICD-10-CM | POA: Diagnosis not present

## 2018-10-14 DIAGNOSIS — I517 Cardiomegaly: Secondary | ICD-10-CM | POA: Diagnosis not present

## 2018-10-14 DIAGNOSIS — K219 Gastro-esophageal reflux disease without esophagitis: Secondary | ICD-10-CM | POA: Diagnosis present

## 2018-10-14 DIAGNOSIS — R0602 Shortness of breath: Secondary | ICD-10-CM | POA: Diagnosis not present

## 2018-10-14 DIAGNOSIS — I82403 Acute embolism and thrombosis of unspecified deep veins of lower extremity, bilateral: Secondary | ICD-10-CM | POA: Diagnosis present

## 2018-10-14 DIAGNOSIS — R7989 Other specified abnormal findings of blood chemistry: Secondary | ICD-10-CM

## 2018-10-14 DIAGNOSIS — R0902 Hypoxemia: Secondary | ICD-10-CM

## 2018-10-14 DIAGNOSIS — I2699 Other pulmonary embolism without acute cor pulmonale: Secondary | ICD-10-CM

## 2018-10-14 DIAGNOSIS — I361 Nonrheumatic tricuspid (valve) insufficiency: Secondary | ICD-10-CM | POA: Diagnosis not present

## 2018-10-14 DIAGNOSIS — I2602 Saddle embolus of pulmonary artery with acute cor pulmonale: Secondary | ICD-10-CM | POA: Diagnosis not present

## 2018-10-14 DIAGNOSIS — J986 Disorders of diaphragm: Secondary | ICD-10-CM | POA: Diagnosis not present

## 2018-10-14 DIAGNOSIS — R778 Other specified abnormalities of plasma proteins: Secondary | ICD-10-CM

## 2018-10-14 DIAGNOSIS — I82413 Acute embolism and thrombosis of femoral vein, bilateral: Secondary | ICD-10-CM | POA: Diagnosis not present

## 2018-10-14 DIAGNOSIS — Z9079 Acquired absence of other genital organ(s): Secondary | ICD-10-CM | POA: Diagnosis not present

## 2018-10-14 LAB — CBC WITH DIFFERENTIAL/PLATELET
Abs Immature Granulocytes: 0.11 10*3/uL — ABNORMAL HIGH (ref 0.00–0.07)
BASOS ABS: 0.1 10*3/uL (ref 0.0–0.1)
Basophils Relative: 0 %
Eosinophils Absolute: 0 10*3/uL (ref 0.0–0.5)
Eosinophils Relative: 0 %
HCT: 39.6 % (ref 39.0–52.0)
Hemoglobin: 12.6 g/dL — ABNORMAL LOW (ref 13.0–17.0)
IMMATURE GRANULOCYTES: 1 %
LYMPHS ABS: 0.9 10*3/uL (ref 0.7–4.0)
Lymphocytes Relative: 6 %
MCH: 30.1 pg (ref 26.0–34.0)
MCHC: 31.8 g/dL (ref 30.0–36.0)
MCV: 94.5 fL (ref 80.0–100.0)
Monocytes Absolute: 0.5 10*3/uL (ref 0.1–1.0)
Monocytes Relative: 3 %
NEUTROS ABS: 13.8 10*3/uL — AB (ref 1.7–7.7)
Neutrophils Relative %: 90 %
Platelets: 254 10*3/uL (ref 150–400)
RBC: 4.19 MIL/uL — ABNORMAL LOW (ref 4.22–5.81)
RDW: 13.3 % (ref 11.5–15.5)
WBC: 15.4 10*3/uL — ABNORMAL HIGH (ref 4.0–10.5)
nRBC: 0 % (ref 0.0–0.2)

## 2018-10-14 LAB — COMPREHENSIVE METABOLIC PANEL
ALT: 38 U/L (ref 0–44)
AST: 24 U/L (ref 15–41)
Albumin: 3.7 g/dL (ref 3.5–5.0)
Alkaline Phosphatase: 126 U/L (ref 38–126)
Anion gap: 7 (ref 5–15)
BUN: 27 mg/dL — ABNORMAL HIGH (ref 8–23)
CO2: 24 mmol/L (ref 22–32)
Calcium: 8.5 mg/dL — ABNORMAL LOW (ref 8.9–10.3)
Chloride: 108 mmol/L (ref 98–111)
Creatinine, Ser: 0.8 mg/dL (ref 0.61–1.24)
GFR calc Af Amer: >60 mL/min (ref >60)
GFR calc non Af Amer: >60 mL/min (ref >60)
GLUCOSE: 126 mg/dL — AB (ref 70–99)
POTASSIUM: 4.8 mmol/L (ref 3.5–5.1)
Sodium: 139 mmol/L (ref 135–145)
Total Bilirubin: 0.4 mg/dL (ref 0.3–1.2)
Total Protein: 6.7 g/dL (ref 6.5–8.1)

## 2018-10-14 LAB — I-STAT TROPONIN, ED
Troponin i, poc: 0.28 ng/mL (ref 0.00–0.08)
Troponin i, poc: 0.37 ng/mL (ref 0.00–0.08)

## 2018-10-14 LAB — ECHOCARDIOGRAM COMPLETE

## 2018-10-14 LAB — PROTIME-INR
INR: 1.02
Prothrombin Time: 13.3 seconds (ref 11.4–15.2)

## 2018-10-14 LAB — BRAIN NATRIURETIC PEPTIDE: B Natriuretic Peptide: 134.4 pg/mL — ABNORMAL HIGH (ref 0.0–100.0)

## 2018-10-14 LAB — URINALYSIS, ROUTINE W REFLEX MICROSCOPIC
BILIRUBIN URINE: NEGATIVE
Glucose, UA: NEGATIVE mg/dL
Hgb urine dipstick: NEGATIVE
Ketones, ur: 5 mg/dL — AB
Leukocytes, UA: NEGATIVE
NITRITE: NEGATIVE
Protein, ur: NEGATIVE mg/dL
Specific Gravity, Urine: >1.046 — ABNORMAL HIGH (ref 1.005–1.030)
pH: 5 (ref 5.0–8.0)

## 2018-10-14 LAB — MRSA PCR SCREENING: MRSA by PCR: NEGATIVE

## 2018-10-14 LAB — LIPASE, BLOOD: Lipase: 24 U/L (ref 11–51)

## 2018-10-14 LAB — GLUCOSE, CAPILLARY: Glucose-Capillary: 97 mg/dL (ref 70–99)

## 2018-10-14 LAB — TROPONIN I: Troponin I: 0.35 ng/mL (ref <0.03)

## 2018-10-14 LAB — HEPARIN LEVEL (UNFRACTIONATED): Heparin Unfractionated: 0.63 IU/mL (ref 0.30–0.70)

## 2018-10-14 LAB — APTT: aPTT: 34 seconds (ref 24–36)

## 2018-10-14 MED ORDER — FENTANYL CITRATE (PF) 100 MCG/2ML IJ SOLN
50.0000 ug | Freq: Once | INTRAMUSCULAR | Status: AC
  Administered 2018-10-14: 50 ug via INTRAVENOUS
  Filled 2018-10-14: qty 2

## 2018-10-14 MED ORDER — IOPAMIDOL (ISOVUE-370) INJECTION 76%
INTRAVENOUS | Status: AC
  Filled 2018-10-14: qty 100

## 2018-10-14 MED ORDER — SODIUM CHLORIDE 0.9 % IV BOLUS: 1000.0000 mL | Freq: Once | INTRAVENOUS | Status: DC

## 2018-10-14 MED ORDER — HEPARIN (PORCINE) 25000 UT/250ML-% IV SOLN
1600.0000 [IU]/h | INTRAVENOUS | Status: DC
  Administered 2018-10-14 – 2018-10-16 (×5): 1600 [IU]/h via INTRAVENOUS
  Filled 2018-10-14 (×5): qty 250

## 2018-10-14 MED ORDER — ORAL CARE MOUTH RINSE
15.0000 mL | Freq: Two times a day (BID) | OROMUCOSAL | Status: DC
  Administered 2018-10-14 – 2018-10-17 (×5): 15 mL via OROMUCOSAL

## 2018-10-14 MED ORDER — SODIUM CHLORIDE 0.9 % IV BOLUS
1000.0000 mL | Freq: Once | INTRAVENOUS | Status: AC
  Administered 2018-10-14: 1000 mL via INTRAVENOUS

## 2018-10-14 MED ORDER — IOPAMIDOL (ISOVUE-370) INJECTION 76%
100.0000 mL | Freq: Once | INTRAVENOUS | Status: AC | PRN
  Administered 2018-10-14: 100 mL via INTRAVENOUS

## 2018-10-14 MED ORDER — DEXTROSE-NACL 5-0.45 % IV SOLN
INTRAVENOUS | Status: DC
  Administered 2018-10-14 – 2018-10-15 (×2): via INTRAVENOUS

## 2018-10-14 MED ORDER — SODIUM CHLORIDE (PF) 0.9 % IJ SOLN
INTRAMUSCULAR | Status: AC
  Filled 2018-10-14: qty 50

## 2018-10-14 MED ORDER — HEPARIN BOLUS VIA INFUSION
5000.0000 [IU] | Freq: Once | INTRAVENOUS | Status: AC
  Administered 2018-10-14: 5000 [IU] via INTRAVENOUS
  Filled 2018-10-14: qty 5000

## 2018-10-14 MED ORDER — PANTOPRAZOLE SODIUM 40 MG PO TBEC
40.0000 mg | DELAYED_RELEASE_TABLET | Freq: Every day | ORAL | Status: DC
  Administered 2018-10-14 – 2018-10-18 (×5): 40 mg via ORAL
  Filled 2018-10-14 (×5): qty 1

## 2018-10-14 NOTE — ED Notes (Signed)
RN and RN Rolla Plate changed patient prior to starting heparin. Pt reports he is still incontinent regarding bladder control and wears depends at home. Pt's grandson reports they will be bringing more of his depends shortly. Barrier cream applied to reduce skin irritation

## 2018-10-14 NOTE — ED Notes (Signed)
ED TO INPATIENT HANDOFF REPORT  Name/Age/Gender Steven Roy 62 y.o. male  Code Status    Code Status Orders  (From admission, onward)         Start     Ordered   10/14/18 1215  Full code  Continuous     10/14/18 1218        Code Status History    Date Active Date Inactive Code Status Order ID Comments User Context   09/30/2018 1839 10/01/2018 1836 Full Code [id number]  [name] Inpatient      Home/SNF/Other Home  Chief Complaint sob  Level of Care/Admitting Diagnosis ED Disposition    ED Disposition Condition [place]: [facility] [[id number]]  Level of Care: ICU [6]  Diagnosis: PE (pulmonary thromboembolism) ([place]) [[id number]]  Admitting Physician: Kipp Brood [[id number]]  Attending Physician: Kipp Brood [[id number]]  Estimated length of stay: past midnight tomorrow  Certification:: I certify this patient will need inpatient services for at least 2 midnights  PT Class (Do Not Modify): Inpatient [101]  PT Acc Code (Do Not Modify): Private [1]       Medical History Past Medical History:  Diagnosis Date  . Anxiety    denies  . Depression    denies  . GERD (gastroesophageal reflux disease)   . PONV (postoperative nausea and vomiting)   . Prostate cancer ([name])     Allergies No Known Allergies  IV Location/Drains/Wounds Patient Lines/Drains/Airways Status   Active Line/Drains/Airways    Name:   Placement date:   Placement time:   Site:   Days:   Peripheral IV 10/14/18 Right Antecubital   10/14/18    0941    Antecubital   less than 1   Peripheral IV 10/14/18 Left Antecubital   10/14/18    1253    Antecubital   less than 1   Urethral Catheter [place], ST Coude 18 Fr.   09/30/18    1627    Coude   14   Incision - 6 Ports Abdomen 1: Left;Lateral;Lower 2: Left;Lateral;Upper 3: Umbilicus 4: Right;Lateral;Lower 5: Right;Medial;Upper 6: Right;Lateral;Upper   09/30/18    1450     14           Labs/Imaging Results for orders placed or performed during the hospital encounter of 10/14/18 (from the past 48 hour(s))  APTT     Status: None   Collection Time: 10/14/18  9:00 AM  Result Value Ref Range   aPTT 34 24 - 36 seconds    Comment: Performed at [facility], [place] [address], [place], [place] [address]  Protime-INR     Status: None   Collection Time: 10/14/18  9:00 AM  Result Value Ref Range   Prothrombin Time 13.3 11.4 - 15.2 seconds   INR 1.02     Comment: Performed at [facility], [place] [address] [place], [place] [address]  CBC with Differential     Status: Abnormal   Collection Time: 10/14/18  9:42 AM  Result Value Ref Range   WBC 15.4 (H) 4.0 - 10.5 K/uL   RBC 4.19 (L) 4.22 - 5.81 MIL/uL   Hemoglobin 12.6 (L) 13.0 - 17.0 g/dL   HCT 39.6 39.0 - 52.0 %   MCV 94.5 80.0 - 100.0 fL   MCH 30.1 26.0 - 34.0 pg   MCHC 31.8 30.0 - 36.0 g/dL   RDW 13.3 11.5 - 15.5 %   Platelets 254 150 -  400 K/uL   nRBC 0.0 0.0 - 0.2 %   Neutrophils Relative % 90 %   Neutro Abs 13.8 (H) 1.7 - 7.7 K/uL   Lymphocytes Relative 6 %   Lymphs Abs 0.9 0.7 - 4.0 K/uL   Monocytes Relative 3 %   Monocytes Absolute 0.5 0.1 - 1.0 K/uL   Eosinophils Relative 0 %   Eosinophils Absolute 0.0 0.0 - 0.5 K/uL   Basophils Relative 0 %   Basophils Absolute 0.1 0.0 - 0.1 K/uL   Immature Granulocytes 1 %   Abs Immature Granulocytes 0.11 (H) 0.00 - 0.07 K/uL    Comment: Performed at [facility], [place] [address], [place], [place] [address]  Comprehensive metabolic panel     Status: Abnormal   Collection Time: 10/14/18  9:42 AM  Result Value Ref Range   Sodium 139 135 - 145 mmol/L   Potassium 4.8 3.5 - 5.1 mmol/L   Chloride 108 98 - 111 mmol/L   CO2 24 22 - 32 mmol/L   Glucose, Bld 126 (H) 70 - 99 mg/dL   BUN 27 (H) 8 - 23 mg/dL   Creatinine, Ser 0.80 0.61 - 1.24 mg/dL   Calcium 8.5 (L) 8.9 - 10.3 mg/dL   Total Protein 6.7 6.5 - 8.1 g/dL    Albumin 3.7 3.5 - 5.0 g/dL   AST 24 15 - 41 U/L   ALT 38 0 - 44 U/L   Alkaline Phosphatase 126 38 - 126 U/L   Total Bilirubin 0.4 0.3 - 1.2 mg/dL   GFR calc non Af Amer >60 >60 mL/min   GFR calc Af Amer >60 >60 mL/min   Anion gap 7 5 - 15    Comment: Performed at [facility], [place] [address] [place], [place] [address]  Lipase, blood     Status: None   Collection Time: 10/14/18  9:42 AM  Result Value Ref Range   Lipase 24 11 - 51 U/L    Comment: Performed at [facility], [place] [address], [place], [place] [address]  Brain natriuretic peptide     Status: Abnormal   Collection Time: 10/14/18  9:42 AM  Result Value Ref Range   B Natriuretic Peptide 134.4 (H) 0.0 - 100.0 pg/mL    Comment: Performed at [facility], [place] [address] [place], [place] [address]  I-stat troponin, ED     Status: Abnormal   Collection Time: 10/14/18  9:47 AM  Result Value Ref Range   Troponin i, poc 0.28 (HH) 0.00 - 0.08 ng/mL   Comment NOTIFIED PHYSICIAN    Comment 3            Comment: Due to the release kinetics of cTnI, a negative result within the first hours of the onset of symptoms does not rule out myocardial infarction with certainty. If myocardial infarction is still suspected, repeat the test at appropriate intervals.   I-stat troponin, ED     Status: Abnormal   Collection Time: 10/14/18 12:54 PM  Result Value Ref Range   Troponin i, poc 0.37 (HH) 0.00 - 0.08 ng/mL   Comment NOTIFIED PHYSICIAN    Comment 3            Comment: Due to the release kinetics of cTnI, a negative result within the first hours of the onset of symptoms does not rule out myocardial infarction with certainty. If myocardial infarction is still suspected, repeat the test at appropriate intervals.  Dg Chest 2 View  Result Date: 10/14/2018 CLINICAL DATA:  Syncopal episode and shortness of breath. EXAM: CHEST - 2 VIEW COMPARISON:  None. FINDINGS: The cardiac  silhouette, mediastinal and hilar contours are within normal limits. The lungs are clear. Mild eventration of the right hemidiaphragm. No pleural effusion or pneumothorax. The bony thorax is intact. No obvious rib fractures. IMPRESSION: No acute cardiopulmonary findings and intact bony thorax. Electronically Signed   By: Marijo Sanes M.D.   On: 10/14/2018 10:03   Ct Angio Chest Pe W And/or Wo Contrast  Result Date: 10/14/2018 CLINICAL DATA:  Recent surgery. Severe dyspnea since 3 a.m. this morning. Hypoxia. EXAM: CT ANGIOGRAPHY CHEST WITH CONTRAST TECHNIQUE: Multidetector CT imaging of the chest was performed using the standard protocol during bolus administration of intravenous contrast. Multiplanar CT image reconstructions and MIPs were obtained to evaluate the vascular anatomy. CONTRAST:  141mL ISOVUE-370 IOPAMIDOL (ISOVUE-370) INJECTION 76% COMPARISON:  Chest radiograph from earlier today. FINDINGS: Cardiovascular: The study is high quality for the evaluation of pulmonary embolism. There is a large burden of acute pulmonary embolism bilaterally involving the lobar, segmental and subsegmental branches of all lung lobes. The most proximal clot is located within the left pulmonary artery. No saddle emboli. Great vessels are normal in course and caliber. Normal heart size. No significant pericardial fluid/thickening. RV LV ratio 2.1. Mediastinum/Nodes: No discrete thyroid nodules. Unremarkable esophagus. No pathologically enlarged axillary, mediastinal or hilar lymph nodes. Lungs/Pleura: No pneumothorax. No pleural effusion. Patchy ground-glass opacity in the right middle and left lower lobes. No lung masses or significant pulmonary nodules. Upper abdomen: Small amount of contrast reflux into the IVC and hepatic veins. Musculoskeletal:  No aggressive appearing focal osseous lesions. Review of the MIP images confirms the above findings. IMPRESSION: 1. Acute bilateral pulmonary embolism with large clot burden.  Most proximal clot is located within the left pulmonary artery. No saddle emboli. 2. Positive for acute PE with CT evidence of right heart strain (RV/LV Ratio = 2.1) consistent with at least submassive (intermediate risk) PE. The presence of right heart strain has been associated with an increased risk of morbidity and mortality. Please activate Code PE by paging [phone]. 3. Patchy ground-glass opacity in the right middle and left lower lobes, nonspecific, which could represent alveolar hemorrhage related to developing pulmonary infarcts versus aspiration. Critical Value/emergent results were called by telephone at the time of interpretation on 10/14/2018 at 11:10 am to Dr. Marda Stalker , who verbally acknowledged these results. Electronically Signed   By: Ilona Sorrel M.D.   On: 10/14/2018 11:18   EKG Interpretation  Date/Time:  Wednesday October 14 2018 11:10:45 EST Ventricular Rate:  98 PR Interval:    QRS Duration: 88 QT Interval:  375 QTC Calculation: 479 R Axis:   52 Text Interpretation:  Sinus rhythm Borderline prolonged QT interval No prior ECG for comparison.  No STEMI Confirmed by Antony Blackbird [phone]) on 10/14/2018 11:15:33 AM   Pending Labs Unresulted Labs (From admission, onward)    Start     Ordered   10/16/18 0500  CBC  Daily,   R     10/14/18 1223   10/15/18 0500  CBC  Tomorrow morning,   R     10/14/18 1218   10/15/18 4742  Basic metabolic panel  Tomorrow morning,   R     10/14/18 1218   10/14/18 1800  Heparin level (unfractionated)  Once-Timed,   R     10/14/18 1223   10/14/18 1416  Troponin I - Now Then Q6H  Now then every 6 hours,   R     10/14/18 1218   10/14/18 1238  Urinalysis, Routine w reflex microscopic  Once,   R     10/14/18 1237          Vitals/Pain Today's Vitals   10/14/18 1315 10/14/18 1330 10/14/18 1345 10/14/18 1400  BP: 123/78 111/85 104/65 102/71  Pulse: (!) 103 (!) 104 (!) 104 (!) 101  Resp: (!) 24 (!) 25 (!) 25 (!) 21  Temp:       TempSrc:      SpO2: 94% 96% 93% 95%  PainSc:        Isolation Precautions No active isolations  Medications Medications  iopamidol (ISOVUE-370) 76 % injection (has no administration in time range)  sodium chloride (PF) 0.9 % injection (has no administration in time range)  heparin ADULT infusion 100 units/mL (25000 units/267mL sodium chloride 0.45%) (1,600 Units/hr Intravenous New Bag/Given 10/14/18 1143)  dextrose 5 %-0.45 % sodium chloride infusion ( Intravenous New Bag/Given 10/14/18 1427)  pantoprazole (PROTONIX) EC tablet 40 mg (40 mg Oral Given 10/14/18 1428)  sodium chloride 0.9 % bolus 1,000 mL (has no administration in time range)  sodium chloride 0.9 % bolus 1,000 mL (1,000 mLs Intravenous New Bag/Given 10/14/18 1008)  fentaNYL (SUBLIMAZE) injection 50 mcg (50 mcg Intravenous Given 10/14/18 1014)  iopamidol (ISOVUE-370) 76 % injection 100 mL (100 mLs Intravenous Contrast Given 10/14/18 1054)  heparin bolus via infusion 5,000 Units (5,000 Units Intravenous Bolus from Bag 10/14/18 1138)    Mobility Walks (Pt has not been walking with this RN due to heparin infusion)

## 2018-10-14 NOTE — Progress Notes (Signed)
Pt received from [facility] per [facility]

## 2018-10-14 NOTE — Consult Note (Signed)
Chief Complaint: Patient was seen in consultation today for possible PE lysis  Referring Physician(s): Dr. Lynetta Mare  Supervising Physician: Sandi Mariscal  Patient Status: [facility] - In-pt  History of Present Illness: Steven Roy is a 62 y.o. male with a past medical history significant for anxiety, depression, GERD and prostate cancer s/p laparoscopic robotic assisted radical prostatectomy on 09/30/18 with Dr. Tresa Moore who presented to [facility] ED today due to new onset of profound dyspnea while ambulating to the bathroom followed by a syncopal episode. Upon arrival to the ED he was significantly hypoxic requiring 8 L of O2, mildly tachycardic and complained of chest pressure and trouble breathing. CTA chest showed acute bilateral PE with large clot burden and evidence of right heart strain. He was transferred to [facility] where he was admitted to the ICU. IR has been consulted for possible PE lysis.  Patient states that he is feeling better since arrival to [facility] - complains of some nausea after riding in the ambulance, continued chest heaviness, difficulty with deep breathing and headache. Dr. Lynetta Mare at bedside evaluating patient and providing thorough overview of PE management.   Past Medical History:  Diagnosis Date  . Anxiety    denies  . Depression    denies  . GERD (gastroesophageal reflux disease)   . PONV (postoperative nausea and vomiting)   . Prostate cancer [facility])     Past Surgical History:  Procedure Laterality Date  . ANKLE ARTHROSCOPY    . HAND SURGERY    . LYMPHADENECTOMY Bilateral 09/30/2018   Procedure: LYMPHADENECTOMY;  Surgeon: Alexis Frock, MD;  Location: [facility] ORS;  Service: Urology;  Laterality: Bilateral;  . PROSTATE BIOPSY    . ROBOT ASSISTED LAPAROSCOPIC RADICAL PROSTATECTOMY N/A 09/30/2018   Procedure: XI ROBOTIC ASSISTED LAPAROSCOPIC RADICAL PROSTATECTOMY;  Surgeon: Alexis Frock, MD;  Location: [facility] ORS;  Service: Urology;  Laterality: N/A;  3 HRS  . XI ROBOTIC  ASSISTED SIMPLE PROSTATECTOMY     Dr. Tresa Moore 09-30-18    Allergies: Patient has no known allergies.  Medications: Prior to Admission medications   Medication Sig Start Date End Date Taking? Authorizing Provider  atorvastatin (LIPITOR) 80 MG tablet Take 80 mg by mouth daily.   Yes [provider]  ferrous sulfate 325 (65 FE) MG EC tablet Take 325 mg by mouth 2 (two) times a week.   Yes [provider]  Multiple Vitamins-Minerals (MENS MULTIVITAMIN PLUS) TABS Take 1 tablet by mouth daily.   Yes [provider]  ranitidine (ZANTAC) 150 MG tablet Take 150 mg by mouth 2 (two) times daily as needed for heartburn.    Yes [provider]  HYDROcodone-acetaminophen (NORCO) 5-325 MG tablet Take 1-2 tablets by mouth every 6 (six) hours as needed for moderate pain. Patient not taking: Reported on 10/14/2018 09/30/18   Debbrah Alar, PA-C  senna-docusate (SENOKOT-S) 8.6-50 MG tablet Take 1 tablet by mouth 2 (two) times daily. While taking strong pain meds to prevent constipation. Patient not taking: Reported on 10/14/2018 10/01/18   Alexis Frock, MD  sulfamethoxazole-trimethoprim (BACTRIM DS,SEPTRA DS) 800-160 MG tablet Take 1 tablet by mouth 2 (two) times daily. Start the day prior to foley removal appointment Patient not taking: Reported on 10/14/2018 09/30/18   Debbrah Alar, PA-C     Family History  Problem Relation Age of Onset  . Cancer Father        unknown  . Prostate cancer Brother        surgery followed by radiation  . Breast  cancer Neg Hx   . Colon cancer Neg Hx   . Pancreatic cancer Neg Hx     Social History   Socioeconomic History  . Marital status: Divorced    Spouse name: Not on file  . Number of children: 4  . Years of education: Not on file  . Highest education level: Not on file  Occupational History    Comment: [employer]   Social Needs  . Financial resource strain: Not on file  . Food insecurity:    Worry: Not on  file    Inability: Not on file  . Transportation needs:    Medical: Not on file    Non-medical: Not on file  Tobacco Use  . Smoking status: Never Smoker  . Smokeless tobacco: Never Used  Substance and Sexual Activity  . Alcohol use: Yes    Comment: occasional  . Drug use: Never  . Sexual activity: Yes  Lifestyle  . Physical activity:    Days per week: Not on file    Minutes per session: Not on file  . Stress: Not on file  Relationships  . Social connections:    Talks on phone: Not on file    Gets together: Not on file    Attends religious service: Not on file    Active member of club or organization: Not on file    Attends meetings of clubs or organizations: Not on file    Relationship status: Not on file  Other Topics Concern  . Not on file  Social History Narrative   Resides in [place].     Review of Systems: A 12 point ROS discussed and pertinent positives are indicated in the HPI above.  All other systems are negative.  Review of Systems  Constitutional: Positive for chills (at home - not currently), fatigue and fever (at home - not currently).  Respiratory: Positive for chest tightness and shortness of breath (with deep breathes). Negative for cough and wheezing.   Cardiovascular: Negative for chest pain and palpitations.  Gastrointestinal: Positive for nausea. Negative for abdominal pain, constipation, diarrhea and vomiting.  Skin: Negative for pallor.  Neurological: Positive for syncope (x 1 at home this morning) and light-headedness (earlier this AM). Negative for dizziness.  Psychiatric/Behavioral: Negative for confusion.    Vital Signs: BP 120/87   Pulse (!) 105   Temp (!) 97.5 F (36.4 C) (Oral)   Resp (!) 25   Ht 6\' 2"  (1.88 m)   Wt 202 lb 6.1 oz (91.8 kg)   SpO2 96%   BMI 25.98 kg/m   Physical Exam  Constitutional: He is oriented to person, place, and time. No distress.  Patient laying comfortably in bed; normal work of breathing, able to  speak in full sentence during exam.  HENT:  Head: Normocephalic.  Cardiovascular: Regular rhythm and normal heart sounds.  Tachycardic  Pulmonary/Chest: Effort normal and breath sounds normal. No respiratory distress. He has no wheezes.  4 L O2 via [place]; some pain with deep breathing  Abdominal: Soft. He exhibits no distension. There is no tenderness.  Neurological: He is alert and oriented to person, place, and time.  Skin: Skin is warm and dry. He is not diaphoretic. No pallor.  Psychiatric: He has a normal mood and affect. His behavior is normal. Judgment and thought content normal.  Nursing note and vitals reviewed.    MD Evaluation Airway: WNL Heart: WNL Abdomen: WNL Chest/ Lungs: WNL ASA  Classification: 3 Mallampati/Airway Score: Two  Imaging: Dg Chest 2 View  Result Date: 10/14/2018 CLINICAL DATA:  Syncopal episode and shortness of breath. EXAM: CHEST - 2 VIEW COMPARISON:  None. FINDINGS: The cardiac silhouette, mediastinal and hilar contours are within normal limits. The lungs are clear. Mild eventration of the right hemidiaphragm. No pleural effusion or pneumothorax. The bony thorax is intact. No obvious rib fractures. IMPRESSION: No acute cardiopulmonary findings and intact bony thorax. Electronically Signed   By: Marijo Sanes M.D.   On: 10/14/2018 10:03   Ct Angio Chest Pe W And/or Wo Contrast  Result Date: 10/14/2018 CLINICAL DATA:  Recent surgery. Severe dyspnea since 3 a.m. this morning. Hypoxia. EXAM: CT ANGIOGRAPHY CHEST WITH CONTRAST TECHNIQUE: Multidetector CT imaging of the chest was performed using the standard protocol during bolus administration of intravenous contrast. Multiplanar CT image reconstructions and MIPs were obtained to evaluate the vascular anatomy. CONTRAST:  112mL ISOVUE-370 IOPAMIDOL (ISOVUE-370) INJECTION 76% COMPARISON:  Chest radiograph from earlier today. FINDINGS: Cardiovascular: The study is high quality for the evaluation of pulmonary  embolism. There is a large burden of acute pulmonary embolism bilaterally involving the lobar, segmental and subsegmental branches of all lung lobes. The most proximal clot is located within the left pulmonary artery. No saddle emboli. Great vessels are normal in course and caliber. Normal heart size. No significant pericardial fluid/thickening. RV LV ratio 2.1. Mediastinum/Nodes: No discrete thyroid nodules. Unremarkable esophagus. No pathologically enlarged axillary, mediastinal or hilar lymph nodes. Lungs/Pleura: No pneumothorax. No pleural effusion. Patchy ground-glass opacity in the right middle and left lower lobes. No lung masses or significant pulmonary nodules. Upper abdomen: Small amount of contrast reflux into the IVC and hepatic veins. Musculoskeletal:  No aggressive appearing focal osseous lesions. Review of the MIP images confirms the above findings. IMPRESSION: 1. Acute bilateral pulmonary embolism with large clot burden. Most proximal clot is located within the left pulmonary artery. No saddle emboli. 2. Positive for acute PE with CT evidence of right heart strain (RV/LV Ratio = 2.1) consistent with at least submassive (intermediate risk) PE. The presence of right heart strain has been associated with an increased risk of morbidity and mortality. Please activate Code PE by paging [phone]. 3. Patchy ground-glass opacity in the right middle and left lower lobes, nonspecific, which could represent alveolar hemorrhage related to developing pulmonary infarcts versus aspiration. Critical Value/emergent results were called by telephone at the time of interpretation on 10/14/2018 at 11:10 am to Dr. Marda Stalker , who verbally acknowledged these results. Electronically Signed   By: Ilona Sorrel M.D.   On: 10/14/2018 11:18   Vas [place] Lower Extremity Venous (dvt)  Result Date: 10/14/2018  Lower Venous Study Indications: Pulmonary embolism.  Performing Technologist: Oliver Hum RVT  Examination  Guidelines: A complete evaluation includes B-mode imaging, spectral Doppler, color Doppler, and power Doppler as needed of all accessible portions of each vessel. Bilateral testing is considered an integral part of a complete examination. Limited examinations for reoccurring indications may be performed as noted.  Right Venous Findings: +---------+---------------+---------+-----------+----------+-------+          CompressibilityPhasicitySpontaneityPropertiesSummary +---------+---------------+---------+-----------+----------+-------+ CFV      Full           Yes      Yes                          +---------+---------------+---------+-----------+----------+-------+ SFJ      Full                                                 +---------+---------------+---------+-----------+----------+-------+  FV Prox  Full                                                 +---------+---------------+---------+-----------+----------+-------+ FV Mid   None           No       No                   Acute   +---------+---------------+---------+-----------+----------+-------+ FV DistalFull                                                 +---------+---------------+---------+-----------+----------+-------+ PFV      Full                                                 +---------+---------------+---------+-----------+----------+-------+ POP      Partial        No       No                   Acute   +---------+---------------+---------+-----------+----------+-------+ PTV      Full                                                 +---------+---------------+---------+-----------+----------+-------+ PERO     None                                         Acute   +---------+---------------+---------+-----------+----------+-------+  Left Venous Findings: +---------+---------------+---------+-----------+----------+-------+          CompressibilityPhasicitySpontaneityPropertiesSummary  +---------+---------------+---------+-----------+----------+-------+ CFV      Full           Yes      Yes                          +---------+---------------+---------+-----------+----------+-------+ SFJ      Full                                                 +---------+---------------+---------+-----------+----------+-------+ FV Prox  Full                                                 +---------+---------------+---------+-----------+----------+-------+ FV Mid   Full                                                 +---------+---------------+---------+-----------+----------+-------+ FV DistalPartial        No  No                   Acute   +---------+---------------+---------+-----------+----------+-------+ PFV      Full                                                 +---------+---------------+---------+-----------+----------+-------+ POP      Partial        No       No                   Acute   +---------+---------------+---------+-----------+----------+-------+ PTV      Full                                                 +---------+---------------+---------+-----------+----------+-------+ PERO     Full                                                 +---------+---------------+---------+-----------+----------+-------+ Gastroc  None                                         Acute   +---------+---------------+---------+-----------+----------+-------+    Summary: Right: Findings consistent with acute deep vein thrombosis involving the right femoral vein, right popliteal vein, and right peroneal vein. No cystic structure found in the popliteal fossa. Left: Findings consistent with acute deep vein thrombosis involving the left femoral vein, left popliteal vein, and left gastrocnemius vein.  *See table(s) above for measurements and observations. Electronically signed by Quay Burow MD on 10/14/2018 at 4:46:52 PM.    Final      Labs:  CBC: Recent Labs    09/22/18 0804 09/30/18 1728 10/01/18 0557 10/14/18 0942  WBC 6.6  --   --  15.4*  HGB 13.9 13.2 12.7* 12.6*  HCT 42.5 40.3 37.6* 39.6  PLT 202  --   --  254    COAGS: Recent Labs    10/14/18 0900  INR 1.02  APTT 34    BMP: Recent Labs    10/01/18 0557 10/14/18 0942  NA 135 139  K 4.0 4.8  CL 103 108  CO2 23 24  GLUCOSE 184* 126*  BUN 20 27*  CALCIUM 8.1* 8.5*  CREATININE 0.83 0.80  GFRNONAA >60 >60  GFRAA >60 >60    LIVER FUNCTION TESTS: Recent Labs    10/14/18 0942  BILITOT 0.4  AST 24  ALT 38  ALKPHOS 126  PROT 6.7  ALBUMIN 3.7    TUMOR MARKERS: No results for input(s): AFPTM, CEA, CA199, CHROMGRNA in the last 8760 hours.  Assessment and Plan:  Patient s/p recent prostatectomy with new onset dyspnea and syncopal episode x 1 this morning who presented to [facility] ED via EMS today. CTA (+) for acute bilateral PE with large clot burden and right heart strain. He was started on IV heparin and transferred to [facility] ICU where he is currently admitted. IR has been consulted for possible PE lysis.   Patient appears improved from  initial presentation with IV heparin and supplemental O2 - SpO2 96-98% on 4 L O2, does not appear to be in respiratory distress on exam. Given overall improvement will hold off on PE lysis at this time, ICU staff will continue to monitor and if patient begins to decline they will contact on call IR MD. Discussed possible procedure with patient who is in agreement to proceed if needed - consent has been signed and is in chart.  Patient to remain NPO for possible procedure, IR will follow up in AM.   Risks and benefits discussed with the patient including, but not limited to bleeding, possible life threatening bleeding and need for blood product transfusion, vascular injury, stroke, contrast induced renal failure, limb loss and infection.  All of the patient's questions were answered, patient is agreeable to  proceed.  Consent signed and in chart.  Thank you for this interesting consult.  I greatly enjoyed meeting ACELIN FERDIG and look forward to participating in their care.  A copy of this report was sent to the requesting provider on this date.  Electronically Signed: Joaquim Nam, PA-C 10/14/2018, 5:06 PM   I spent a total of 40 Minutes in face to face in clinical consultation, greater than 50% of which was counseling/coordinating care for PE lysis.

## 2018-10-14 NOTE — ED Provider Notes (Signed)
[facility] DEPT Provider Note   CSN: [id number] Arrival date & time: 10/14/18  0109     History   Chief Complaint Chief Complaint  Patient presents with  . Shortness of Breath    HPI Steven Roy is a 62 y.o. male.  The history is provided by the patient and medical records. No language interpreter was used.  Shortness of Breath  This is a new problem. The average episode lasts 6 hours. The problem occurs continuously.The current episode started 6 to 12 hours ago. The problem has not changed since onset.Associated symptoms include chest pain. Pertinent negatives include no fever, no headaches, no rhinorrhea, no sore throat, no neck pain, no cough, no sputum production, no wheezing, no vomiting, no abdominal pain, no rash, no leg pain and no leg swelling. It is unknown what precipitated the problem. Risk factors: recent surgery. He has tried nothing for the symptoms. The treatment provided no relief. He has had prior hospitalizations. Associated medical issues include recent surgery. Associated medical issues do not include asthma, COPD, pneumonia, chronic lung disease, PE, CAD, heart failure, past MI or DVT.    Past Medical History:  Diagnosis Date  . Anxiety    denies  . Depression    denies  . GERD (gastroesophageal reflux disease)   . PONV (postoperative nausea and vomiting)   . Prostate cancer [facility])     Patient Active Problem List   Diagnosis Date Noted  . Prostate cancer ([place]) 09/30/2018  . Malignant neoplasm of prostate ([place]) 09/16/2018  . Depression 08/11/2018  . Iron deficiency anemia 05/17/2018  . Mixed hyperlipidemia 01/11/2016    Past Surgical History:  Procedure Laterality Date  . ANKLE ARTHROSCOPY    . HAND SURGERY    . LYMPHADENECTOMY Bilateral 09/30/2018   Procedure: LYMPHADENECTOMY;  Surgeon: Alexis Frock, MD;  Location: [facility] ORS;  Service: Urology;  Laterality: Bilateral;  . PROSTATE BIOPSY    . ROBOT ASSISTED  LAPAROSCOPIC RADICAL PROSTATECTOMY N/A 09/30/2018   Procedure: XI ROBOTIC ASSISTED LAPAROSCOPIC RADICAL PROSTATECTOMY;  Surgeon: Alexis Frock, MD;  Location: [facility] ORS;  Service: Urology;  Laterality: N/A;  3 HRS  . XI ROBOTIC ASSISTED SIMPLE PROSTATECTOMY     Dr. Tresa Moore 09-30-18        Home Medications    Prior to Admission medications   Medication Sig Start Date End Date Taking? Authorizing Provider  atorvastatin (LIPITOR) 80 MG tablet Take 80 mg by mouth daily.    [provider]  HYDROcodone-acetaminophen (NORCO) 5-325 MG tablet Take 1-2 tablets by mouth every 6 (six) hours as needed for moderate pain. 09/30/18   Debbrah Alar, PA-C  IRON PO Take 1 tablet by mouth daily.    [provider]  ranitidine (ZANTAC) 150 MG tablet Take 150 mg by mouth 2 (two) times daily.    [provider]  senna-docusate (SENOKOT-S) 8.6-50 MG tablet Take 1 tablet by mouth 2 (two) times daily. While taking strong pain meds to prevent constipation. 10/01/18   Alexis Frock, MD  sulfamethoxazole-trimethoprim (BACTRIM DS,SEPTRA DS) 800-160 MG tablet Take 1 tablet by mouth 2 (two) times daily. Start the day prior to foley removal appointment 09/30/18   Debbrah Alar, PA-C    Family History Family History  Problem Relation Age of Onset  . Cancer Father        unknown  . Prostate cancer Brother        surgery followed by radiation  . Breast cancer Neg Hx   .  Colon cancer Neg Hx   . Pancreatic cancer Neg Hx     Social History Social History   Tobacco Use  . Smoking status: Never Smoker  . Smokeless tobacco: Never Used  Substance Use Topics  . Alcohol use: Yes    Comment: occasional  . Drug use: Never     Allergies   Patient has no known allergies.   Review of Systems Review of Systems  Constitutional: Positive for diaphoresis. Negative for chills, fatigue and fever.  HENT: Negative for congestion, rhinorrhea and sore throat.   Respiratory: Positive for chest  tightness and shortness of breath. Negative for cough, sputum production, choking, wheezing and stridor.   Cardiovascular: Positive for chest pain. Negative for palpitations and leg swelling.  Gastrointestinal: Negative for abdominal pain, constipation, diarrhea, nausea and vomiting.  Genitourinary: Negative for flank pain and frequency.  Musculoskeletal: Negative for back pain, neck pain and neck stiffness.  Skin: Negative for rash and wound.  Neurological: Positive for syncope. Negative for weakness, light-headedness, numbness and headaches.  Psychiatric/Behavioral: Negative for agitation and confusion.  All other systems reviewed and are negative.    Physical Exam Updated Vital Signs BP 115/77 (BP Location: Right Arm)   Pulse (!) 109   Temp (!) 97.1 F (36.2 C) (Oral)   Resp (!) 21   SpO2 93% Comment: Pt. came in with O2 stats at 83% was placed on 3 liters. Pt. sats are at 93 while on 3 liters. Nurse aware.  Physical Exam  Constitutional: He appears well-developed and well-nourished.  Non-toxic appearance. He appears ill. No distress.  HENT:  Head: Normocephalic and atraumatic.  Nose: Nose normal.  Mouth/Throat: Oropharynx is clear and moist. No oropharyngeal exudate.  Eyes: Pupils are equal, round, and reactive to light. Conjunctivae and EOM are normal.  Neck: Normal range of motion. Neck supple.  Cardiovascular: Regular rhythm. Tachycardia present.  No murmur heard. Pulmonary/Chest: Effort normal and breath sounds normal. Tachypnea noted. No respiratory distress. He has no decreased breath sounds. He has no wheezes. He has no rhonchi. He has no rales.  Abdominal: Soft. There is no tenderness.  Musculoskeletal: He exhibits no edema or tenderness.       Right lower leg: He exhibits no tenderness and no edema.       Left lower leg: He exhibits no tenderness and no edema.  Neurological: He is alert. No sensory deficit. He exhibits normal muscle tone.  Skin: Skin is warm.  Capillary refill takes less than 2 seconds. He is diaphoretic. No erythema. No pallor.  Psychiatric: He has a normal mood and affect.  Nursing note and vitals reviewed.    ED Treatments / Results  Labs (all labs ordered are listed, but only abnormal results are displayed) Labs Reviewed  CBC WITH DIFFERENTIAL/PLATELET - Abnormal; Notable for the following components:      Result Value   WBC 15.4 (*)    RBC 4.19 (*)    Hemoglobin 12.6 (*)    Neutro Abs 13.8 (*)    Abs Immature Granulocytes 0.11 (*)    All other components within normal limits  COMPREHENSIVE METABOLIC PANEL - Abnormal; Notable for the following components:   Glucose, Bld 126 (*)    BUN 27 (*)    Calcium 8.5 (*)    All other components within normal limits  BRAIN NATRIURETIC PEPTIDE - Abnormal; Notable for the following components:   B Natriuretic Peptide 134.4 (*)    All other components within normal limits  I-STAT TROPONIN,  ED - Abnormal; Notable for the following components:   Troponin i, poc 0.28 (*)    All other components within normal limits  I-STAT TROPONIN, ED - Abnormal; Notable for the following components:   Troponin i, poc 0.37 (*)    All other components within normal limits  LIPASE, BLOOD  APTT  PROTIME-INR  TROPONIN I  TROPONIN I  HEPARIN LEVEL (UNFRACTIONATED)  URINALYSIS, ROUTINE W REFLEX MICROSCOPIC  TROPONIN I    EKG EKG Interpretation  Date/Time:  Wednesday October 14 2018 11:10:45 EST Ventricular Rate:  98 PR Interval:    QRS Duration: 88 QT Interval:  375 QTC Calculation: 479 R Axis:   52 Text Interpretation:  Sinus rhythm Borderline prolonged QT interval No prior ECG for comparison.  No STEMI Confirmed by Antony Blackbird [phone]) on 10/14/2018 11:15:33 AM   Radiology Dg Chest 2 View  Result Date: 10/14/2018 CLINICAL DATA:  Syncopal episode and shortness of breath. EXAM: CHEST - 2 VIEW COMPARISON:  None. FINDINGS: The cardiac silhouette, mediastinal and hilar contours are  within normal limits. The lungs are clear. Mild eventration of the right hemidiaphragm. No pleural effusion or pneumothorax. The bony thorax is intact. No obvious rib fractures. IMPRESSION: No acute cardiopulmonary findings and intact bony thorax. Electronically Signed   By: Marijo Sanes M.D.   On: 10/14/2018 10:03   Ct Angio Chest Pe W And/or Wo Contrast  Result Date: 10/14/2018 CLINICAL DATA:  Recent surgery. Severe dyspnea since 3 a.m. this morning. Hypoxia. EXAM: CT ANGIOGRAPHY CHEST WITH CONTRAST TECHNIQUE: Multidetector CT imaging of the chest was performed using the standard protocol during bolus administration of intravenous contrast. Multiplanar CT image reconstructions and MIPs were obtained to evaluate the vascular anatomy. CONTRAST:  18mL ISOVUE-370 IOPAMIDOL (ISOVUE-370) INJECTION 76% COMPARISON:  Chest radiograph from earlier today. FINDINGS: Cardiovascular: The study is high quality for the evaluation of pulmonary embolism. There is a large burden of acute pulmonary embolism bilaterally involving the lobar, segmental and subsegmental branches of all lung lobes. The most proximal clot is located within the left pulmonary artery. No saddle emboli. Great vessels are normal in course and caliber. Normal heart size. No significant pericardial fluid/thickening. RV LV ratio 2.1. Mediastinum/Nodes: No discrete thyroid nodules. Unremarkable esophagus. No pathologically enlarged axillary, mediastinal or hilar lymph nodes. Lungs/Pleura: No pneumothorax. No pleural effusion. Patchy ground-glass opacity in the right middle and left lower lobes. No lung masses or significant pulmonary nodules. Upper abdomen: Small amount of contrast reflux into the IVC and hepatic veins. Musculoskeletal:  No aggressive appearing focal osseous lesions. Review of the MIP images confirms the above findings. IMPRESSION: 1. Acute bilateral pulmonary embolism with large clot burden. Most proximal clot is located within the left  pulmonary artery. No saddle emboli. 2. Positive for acute PE with CT evidence of right heart strain (RV/LV Ratio = 2.1) consistent with at least submassive (intermediate risk) PE. The presence of right heart strain has been associated with an increased risk of morbidity and mortality. Please activate Code PE by paging [phone]. 3. Patchy ground-glass opacity in the right middle and left lower lobes, nonspecific, which could represent alveolar hemorrhage related to developing pulmonary infarcts versus aspiration. Critical Value/emergent results were called by telephone at the time of interpretation on 10/14/2018 at 11:10 am to Dr. Marda Stalker , who verbally acknowledged these results. Electronically Signed   By: Ilona Sorrel M.D.   On: 10/14/2018 11:18    Procedures Procedures (including critical care time)  CRITICAL CARE Performed by: Harrell Gave  J Rashon Rezek Total critical care time: 60 minutes Critical care time was exclusive of separately billable procedures and treating other patients. Critical care was necessary to treat or prevent imminent or life-threatening deterioration. Critical care was time spent personally by me on the following activities: development of treatment plan with patient and/or surrogate as well as nursing, discussions with consultants, evaluation of patient's response to treatment, examination of patient, obtaining history from patient or surrogate, ordering and performing treatments and interventions, ordering and review of laboratory studies, ordering and review of radiographic studies, pulse oximetry and re-evaluation of patient's condition.   Medications Ordered in ED Medications  iopamidol (ISOVUE-370) 76 % injection (has no administration in time range)  sodium chloride (PF) 0.9 % injection (has no administration in time range)  heparin ADULT infusion 100 units/mL (25000 units/271mL sodium chloride 0.45%) (1,600 Units/hr Intravenous New Bag/Given 10/14/18  1143)  dextrose 5 %-0.45 % sodium chloride infusion (has no administration in time range)  pantoprazole (PROTONIX) EC tablet 40 mg (has no administration in time range)  sodium chloride 0.9 % bolus 1,000 mL (has no administration in time range)  sodium chloride 0.9 % bolus 1,000 mL (1,000 mLs Intravenous New Bag/Given 10/14/18 1008)  fentaNYL (SUBLIMAZE) injection 50 mcg (50 mcg Intravenous Given 10/14/18 1014)  iopamidol (ISOVUE-370) 76 % injection 100 mL (100 mLs Intravenous Contrast Given 10/14/18 1054)  heparin bolus via infusion 5,000 Units (5,000 Units Intravenous Bolus from Bag 10/14/18 1138)     Initial Impression / Assessment and Plan / ED Course  I have reviewed the triage vital signs and the nursing notes.  Pertinent labs & imaging results that were available during my care of the patient were reviewed by me and considered in my medical decision making (see chart for details).     Steven Roy is a 62 y.o. male with a past medical history significant for prostate cancer status post prostatectomy surgery 1.5 weeks ago who presents for syncope, shortness breath, and hypoxia.  Patient reports that he has been doing well postoperatively since his prostatectomy a week and a half ago but overnight he woke up with chest tightness, chest pressure, and shortness of breath.  While trying to walk to the bathroom, patient syncopized.  He is unsure how long he passed out for.  He reports that he was extremely short of breath and called 911.  According to EMS report to nursing, option saturations are in the 70s on their arrival.  Patient had to be on 8 L during transport to get him up into the 90% range oxygenation.  Patient reports he is having diffuse chest tightness and pressure and reports is extremely pleuritic.  He still feels lightheaded.  He denies any vomiting but does report some nausea.  He reports diaphoresis.  He reports the pain does not radiate.  He has never had this pain before.  He  denies any leg pain or leg swelling although he reports shortly after surgery he did have some mild leg cramping bilaterally that resolved.  He has no personal or family history of DVT or PE.  He denies any other complaints on arrival.  No recent symptoms over the last several days leading up to this.   On exam, patient is tachypneic and tachycardic.  Patient's chest is nontender.  Lungs are clear.  Abdomen is nontender.  Legs are nonedematous and nontender.  Patient is on 3 L nasal cannula keeping his oxygen saturations in the 90s.  He is still diaphoretic.  His pain has improved.  Clinically I am extremely concerned about pulmonary embolism.  Also considering postoperative infection or atelectasis.  Patient will have initial chest x-ray and then a PE study.  He will have screen laboratory testing including troponin.  Anticipate admission for chest pain, shortness breath and hypoxia.  11:07 AM Patient's initial troponin was elevated.  High concern for pulmonary embolism.  I reviewed patient CT scan as he was being collected and saw a large amount of bilateral pulmonary embolism.  Radiology was called to confirm and they see large amount of right heart strain.  Radiology agreed with speaking with vertical care to discuss management.  Heparin was ordered and critical care was called.  They will see patient for likely admission and discussion for other interventions.  2:18 PM Repeat troponin is rising.  Patient's blood pressure has softened and is right around 035 systolic.  I called the critical care team at [facility] to try and expedite his transfer and they request this be expedited to the ICU at [facility].  Patient was given more fluids.  I spoke with bed placement to help facilitate this and reentered in the admission order.  Spoke with the ultrasound tech who adjusted an echo on his heart and she is concerned about right sided dilation that is severe.   Patient remains on heparin.  Ultrasound reports  that patient has bilateral DVTs.  Patient will be transferred admitted to ICU at [facility] as quickly as possible.  Patient reassessed from her transfer and had some improvement in his pain.  Blood pressure remains normotensive although he is still slightly tachycardic.  Patient transferred for further management.   Final Clinical Impressions(s) / ED Diagnoses   Final diagnoses:  Hypoxia  SOB (shortness of breath)  Precordial pain  Elevated troponin  Other acute pulmonary embolism, unspecified whether acute cor pulmonale present [facility])    ED Discharge Orders    None     Clinical Impression: 1. Hypoxia   2. SOB (shortness of breath)   3. Precordial pain   4. Elevated troponin   5. Other acute pulmonary embolism, unspecified whether acute cor pulmonale present ([place])     Disposition: Admit  This note was prepared with assistance of Dragon voice recognition software. Occasional wrong-word or sound-a-like substitutions may have occurred due to the inherent limitations of voice recognition software.     Caili Escalera, Gwenyth Allegra, MD 10/14/18 [phone]

## 2018-10-14 NOTE — Progress Notes (Signed)
eLink Physician-Brief Progress Note Patient Name: Steven Roy DOB: April 13, 1956 MRN: [id number]   Date of Service  10/14/2018  HPI/Events of Note  Recent diagnosis of prostate cancer s/p robot assisted radical prostatectomy with lymph nod dissection. Presents with bilateral PE with RV strain. He has not received intra-pulmonary TPA due to concerns about bleeding from the prostate bed. Currently hemodynamically stable.  eICU Interventions  Monitor closely. Intra-pulmonary TPA for any signs of evolving cardio-pulmonary instability.        Kerry Kass Ogan 10/14/2018, 9:19 PM

## 2018-10-14 NOTE — ED Notes (Signed)
[facility] here for transport.  

## 2018-10-14 NOTE — Progress Notes (Signed)
ANTICOAGULATION CONSULT NOTE  Pharmacy Consult:  Heparin  Indication: pulmonary embolus  No Known Allergies  Patient Measurements: Height: 6\' 2"  (188 cm) Weight: 202 lb 6.1 oz (91.8 kg) IBW/kg (Calculated) : 82.2 Heparin Dosing Weight: 95 kg  Vital Signs: Temp: 98 F (36.7 C) (12/04 1958) Temp Source: Oral (12/04 1958) BP: 119/76 (12/04 2000) Pulse Rate: 101 (12/04 2015)  Labs: Recent Labs    10/14/18 0900 10/14/18 0942 10/14/18 2003  HGB  --  12.6*  --   HCT  --  39.6  --   PLT  --  254  --   APTT 34  --   --   LABPROT 13.3  --   --   INR 1.02  --   --   HEPARINUNFRC  --   --  0.63  CREATININE  --  0.80  --     Estimated Creatinine Clearance: 111.3 mL/min (by C-G formula based on SCr of 0.8 mg/dL).   Assessment: 43 YOM with a with a recent history of prostatectomy on 09/30/18 presented to [facility] with SOB and chest pain.  CT confirmed PE with RHS and Doppler also positive for bilateral DVTs.  Pharmacy consulted to dose IV heparin.  Patient transferred to [facility] for possible lysis, but given improvement lysis is postponed for now.  Heparin level is therapeutic; no bleeding reported.  Goal of Therapy:  Heparin level 0.3-0.7 units/ml Monitor platelets by anticoagulation protocol: Yes   Plan:  Continue heparin gtt at 1600 units/hr F/U AM labs   Sevyn Markham D. Mina Marble, PharmD, BCPS, [place] 10/14/2018, 9:00 PM

## 2018-10-14 NOTE — Significant Event (Signed)
Critical care attending:  62 year old man received in transfer from [facility] because of submassive PE.  Details in Dr. Bari Mantis note.  Patient feels better.  Family reports color has improved.  On my examination: He is in no distress.  There is no accessory muscle use.  He is saturating 97% on 4 L nasal cannula.  Blood pressure is 462-863 systolic.  Tachycardia at 100 and sinus rhythm.  Chest is clear.  Heart sounds are unremarkable.  JVP is elevated with CV waves consistent with tricuspid regurgitation.  Peripheral pulses are intact with no leg edema.  Negative Homans sign.  I performed a limited bedside echo compared to the formal echo study done earlier today.  Qualitatively it is no worse and there may be some improvement as the flattening is less prominent.  The left ventricle appears better filled on my study.  The RV remains dilated and there is still moderate tricuspid regurgitation.    Assessment  Submassive pulmonary embolism with right heart strain.  Interval clinical and possibly echocardiographic improvement.  Discussed with Dr. Pascal Lux (IR).  Continue anticoagulation for now.  Catheter directed thrombolysis on standby in case of clinical deterioration.  Steven Brood, MD [facility] ICU Physician [place]  Pager: [phone] Mobile: [phone] After hours: [phone].  10/14/2018, 5:24 PM

## 2018-10-14 NOTE — ED Notes (Signed)
Notified Dr Sherry Ruffing of patient's troponin result of 0.28.

## 2018-10-14 NOTE — H&P (Signed)
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  He underwent laparoscopic robotic assisted radical prostatectomy 11/20 by Dr. Tresa Moore .  2 out of 4 lymph nodes were reported positive, he had a good recovery and was ambulatory.  He had a good day yesterday and walked 4000 steps.  He had postop incontinence and was wearing diapers. He walked to the bathroom and passed out last night for a few seconds.  This morning was significantly short of breath while walking to the bathroom and came into the emergency room where CT angiogram was performed that confirmed bilateral PE with significant clot burden.  He required 8 L of oxygen in transport, initial oxygen saturation in the 70s on room air and is now requiring 4 L of oxygen.  He had mild tachycardia but normal blood pressure on arrival. Initial labs were also significant for troponin of 0.28  He denies chest pain or hemoptysis.  No significant family history of VTE.    SIGNIFICANT EVENTS    STUDIES:  12/4 >> CT angiogram chest -bilateral pulmonary emboli with RV/LV ratio 2.1     PAST MEDICAL HISTORY :   has a past medical history of Anxiety, Depression, GERD (gastroesophageal reflux disease), PONV (postoperative nausea and vomiting), and Prostate cancer ([place]).  has a past surgical history that includes Prostate biopsy; Ankle arthroscopy; Hand surgery; XI robotic assisted simple prostatectomy; Robot assisted laparoscopic radical prostatectomy (N/A, 09/30/2018); and Lymphadenectomy (Bilateral, 09/30/2018). Prior to Admission medications   Medication Sig Start Date End Date Taking? Authorizing Provider  atorvastatin (LIPITOR) 80 MG tablet Take 80 mg by mouth daily.   Yes [provider]  ferrous sulfate 325 (65 FE) MG EC tablet Take 325 mg by mouth 2 (two) times a week.   Yes [provider]  Multiple Vitamins-Minerals (MENS MULTIVITAMIN PLUS) TABS Take 1 tablet by mouth daily.   Yes [provider]  ranitidine (ZANTAC) 150 MG tablet Take 150 mg by mouth 2 (two) times daily as needed for heartburn.    Yes [provider]  HYDROcodone-acetaminophen (NORCO) 5-325 MG tablet Take 1-2 tablets by mouth every 6 (six) hours as needed for moderate pain. Patient not taking: Reported on 10/14/2018 09/30/18   Debbrah Alar, PA-C  senna-docusate (SENOKOT-S) 8.6-50 MG tablet Take 1 tablet by mouth 2 (two) times daily. While taking strong pain meds to prevent constipation. Patient not taking: Reported on 10/14/2018 10/01/18   Alexis Frock, MD  sulfamethoxazole-trimethoprim (BACTRIM DS,SEPTRA DS) 800-160 MG tablet Take 1 tablet by mouth 2 (two) times daily. Start the day prior to foley removal appointment Patient not taking: Reported on 10/14/2018 09/30/18   Debbrah Alar, PA-C   No Known Allergies  FAMILY HISTORY:  family history includes Cancer in his father; Prostate cancer in his brother. SOCIAL HISTORY:  reports that he has never smoked. He has never used smokeless tobacco. He reports that he drinks alcohol. He reports that he does not use drugs.  REVIEW OF SYSTEMS:   Constitutional: Negative for fever, chills, weight loss, malaise/fatigue and diaphoresis.  HENT: Negative for hearing loss, ear pain, nosebleeds, congestion, sore throat, neck pain, tinnitus and ear discharge.   Eyes: Negative for blurred vision, double vision, photophobia, pain, discharge and redness.  Respiratory:  Negative for cough, hemoptysis, sputum production, wheezing and stridor.   Cardiovascular: Negative for palpitations, orthopnea, claudication, leg swelling and PND.  Gastrointestinal: Negative for heartburn, nausea, vomiting, abdominal pain, diarrhea, constipation,  blood in stool and melena.  Genitourinary: Negative for hematuria and flank pain.  Musculoskeletal: Negative for myalgias, back pain, joint pain and falls.  Skin: Negative for itching and rash.  Neurological: Negative for dizziness, tingling, tremors, sensory change, speech change, focal weakness, seizures, loss of consciousness, weakness and headaches.  Endo/Heme/Allergies: Negative for environmental allergies and polydipsia. Does not bruise/bleed easily.  SUBJECTIVE:   VITAL SIGNS: Temp:  [97.1 F (36.2 C)] 97.1 F (36.2 C) (12/04 0906) Pulse Rate:  [101-109] 101 (12/04 1200) Resp:  [21-23] 22 (12/04 1200) BP: (114-132)/(77-91) 132/91 (12/04 1200) SpO2:  [93 %-97 %] 97 % (12/04 1200)  PHYSICAL EXAMINATION: Gen. Pleasant, well-nourished, in no distress, normal affect ENT - no lesions, no post nasal drip Neck: No JVD, no thyromegaly, no carotid bruits Lungs: no use of accessory muscles, no dullness to percussion, clear without rales or rhonchi  Cardiovascular: Rhythm regular, heart sounds  normal, no murmurs or gallops, no peripheral edema Abdomen: soft and non-tender, no hepatosplenomegaly, BS normal. Musculoskeletal: No deformities, no cyanosis or clubbing Neuro:  alert, non focal   Recent Labs  Lab 10/14/18 0942  NA 139  K 4.8  CL 108  CO2 24  BUN 27*  CREATININE 0.80  GLUCOSE 126*   Recent Labs  Lab 10/14/18 0942  HGB 12.6*  HCT 39.6  WBC 15.4*  PLT 254   Dg Chest 2 View  Result Date: 10/14/2018 CLINICAL DATA:  Syncopal episode and shortness of breath. EXAM: CHEST - 2 VIEW COMPARISON:  None. FINDINGS: The cardiac silhouette, mediastinal and hilar contours are within normal limits. The lungs are clear. Mild eventration of the right hemidiaphragm. No pleural effusion or pneumothorax. The bony thorax is intact. No obvious rib fractures. IMPRESSION: No acute cardiopulmonary findings and intact bony thorax. Electronically Signed   By: Marijo Sanes M.D.   On:  10/14/2018 10:03   Ct Angio Chest Pe W And/or Wo Contrast  Result Date: 10/14/2018 CLINICAL DATA:  Recent surgery. Severe dyspnea since 3 a.m. this morning. Hypoxia. EXAM: CT ANGIOGRAPHY CHEST WITH CONTRAST TECHNIQUE: Multidetector CT imaging of the chest was performed using the standard protocol during bolus administration of intravenous contrast. Multiplanar CT image reconstructions and MIPs were obtained to evaluate the vascular anatomy. CONTRAST:  161mL ISOVUE-370 IOPAMIDOL (ISOVUE-370) INJECTION 76% COMPARISON:  Chest radiograph from earlier today. FINDINGS: Cardiovascular: The study is high quality for the evaluation of pulmonary embolism. There is a large burden of acute pulmonary embolism bilaterally involving the lobar, segmental and subsegmental branches of all lung lobes. The most proximal clot is located within the left pulmonary artery. No saddle emboli. Great vessels are normal in course and caliber. Normal heart size. No significant pericardial fluid/thickening. RV LV ratio 2.1. Mediastinum/Nodes: No discrete thyroid nodules. Unremarkable esophagus. No pathologically enlarged axillary, mediastinal or hilar lymph nodes. Lungs/Pleura: No pneumothorax. No pleural effusion. Patchy ground-glass opacity in the right middle and left lower lobes. No lung masses or significant pulmonary nodules. Upper abdomen: Small amount of contrast reflux into the IVC and hepatic veins. Musculoskeletal:  No aggressive appearing focal osseous lesions. Review of the MIP images confirms the above findings. IMPRESSION: 1. Acute bilateral pulmonary embolism with large clot burden. Most proximal clot is located within the left pulmonary artery. No saddle emboli. 2. Positive for acute PE with  CT evidence of right heart strain (RV/LV Ratio = 2.1) consistent with at least submassive (intermediate risk) PE. The presence of right heart strain has been associated with an increased risk of morbidity and mortality. Please activate  Code PE by paging [phone]. 3. Patchy ground-glass opacity in the right middle and left lower lobes, nonspecific, which could represent alveolar hemorrhage related to developing pulmonary infarcts versus aspiration. Critical Value/emergent results were called by telephone at the time of interpretation on 10/14/2018 at 11:10 am to Dr. Marda Stalker , who verbally acknowledged these results. Electronically Signed   By: Ilona Sorrel M.D.   On: 10/14/2018 11:18    ASSESSMENT / PLAN:  Submassive PE-meets criteria due to high clot burden and RV/LV ratio of 2.1 on CT scan suggesting RV strain.  Of concern clinically is the fact that he had syncope and low troponin leak.  I discussed with interventional radiology Dr. Pascal Lux, of concern that he had prostatectomy 2 weeks ago and high risk of bleeding with TPA. We will admit him to [facility] just in case catheter lysis is required  Recommend -IV heparin for now with close monitoring of heparin levels Bedrest next 24 hours Duplex of both lower extremities Echo to look for RV strain Trend troponins to capture peak   Kara Mead MD. Shade Flood. [facility] Pager [phone] If no response call [phone]    10/14/2018, 12:21 PM

## 2018-10-14 NOTE — Progress Notes (Signed)
ANTICOAGULATION CONSULT NOTE - Initial Consult  Pharmacy Consult for Heparin  Indication: pulmonary embolus  No Known Allergies  Patient Measurements:   Heparin Dosing Weight: 95 kg  Vital Signs: Temp: 97.1 F (36.2 C) (12/04 0906) Temp Source: Oral (12/04 0906) BP: 115/77 (12/04 0906) Pulse Rate: 109 (12/04 0906)  Labs: Recent Labs    10/14/18 0942  HGB 12.6*  HCT 39.6  PLT 254  CREATININE 0.80    Estimated Creatinine Clearance: 111.3 mL/min (by C-G formula based on SCr of 0.8 mg/dL).   Medical History: Past Medical History:  Diagnosis Date  . Anxiety    denies  . Depression    denies  . GERD (gastroesophageal reflux disease)   . PONV (postoperative nausea and vomiting)   . Prostate cancer [facility])     Medications:  No anticoagulants PTA   Assessment: 62 y/o M with a with a recent h/o prostatectomy 11/20 admitted with SOB and chest pain. CT confirmed PE with right heart strain.   Goal of Therapy:  Heparin level 0.3-0.7 units/ml Monitor platelets by anticoagulation protocol: Yes   Plan:  Give 5000 units bolus x 1 Start heparin infusion at 1600 units/hr Check anti-Xa level in 6 hours and daily while on heparin Continue to monitor H&H and platelets  Ulice Dash D 10/14/2018,11:23 AM

## 2018-10-14 NOTE — ED Triage Notes (Signed)
Per EMS, pt complains of shortness of breath.  Pt had syncopal episode last night. Pt had prostatectomy on 11/20. EKG sinus tach. O2 was in 70s upon EMS arrival, pt is not normally on O2. Sats improved to 100% on 8L nonrebreather.  BP 160/80, dropped to 100/70 en route HR 110 RR 24

## 2018-10-14 NOTE — ED Notes (Addendum)
Repeat EKG done and given to EDP Tegler

## 2018-10-14 NOTE — Progress Notes (Signed)
Paged a Code PE to DR Agarwala - says he has already spoken to IR

## 2018-10-14 NOTE — Progress Notes (Signed)
Bilateral lower extremity venous duplex has been completed. There is evidence of acute deep vein thrombosis involving the femoral, popliteal, and peroneal veins of the right lower extremity. There is also evidence of acute deep vein thrombosis involving the femoral, popliteal, and intramuscular gastrocnemius veins of the left lower extremity. Results were given to Dr. Sherry Ruffing.   10/14/18 2:44 PM Steven Roy RVT

## 2018-10-14 NOTE — ED Notes (Signed)
RN gave carelink report

## 2018-10-15 ENCOUNTER — Inpatient Hospital Stay (HOSPITAL_COMMUNITY): Payer: 59

## 2018-10-15 DIAGNOSIS — I2699 Other pulmonary embolism without acute cor pulmonale: Secondary | ICD-10-CM

## 2018-10-15 DIAGNOSIS — I517 Cardiomegaly: Secondary | ICD-10-CM

## 2018-10-15 DIAGNOSIS — Z9079 Acquired absence of other genital organ(s): Secondary | ICD-10-CM

## 2018-10-15 DIAGNOSIS — I82413 Acute embolism and thrombosis of femoral vein, bilateral: Secondary | ICD-10-CM

## 2018-10-15 LAB — GLUCOSE, CAPILLARY: Glucose-Capillary: 113 mg/dL — ABNORMAL HIGH (ref 70–99)

## 2018-10-15 LAB — CBC
HCT: 37.3 % — ABNORMAL LOW (ref 39.0–52.0)
Hemoglobin: 11.6 g/dL — ABNORMAL LOW (ref 13.0–17.0)
MCH: 28.6 pg (ref 26.0–34.0)
MCHC: 31.1 g/dL (ref 30.0–36.0)
MCV: 91.9 fL (ref 80.0–100.0)
Platelets: 252 10*3/uL (ref 150–400)
RBC: 4.06 MIL/uL — ABNORMAL LOW (ref 4.22–5.81)
RDW: 13.3 % (ref 11.5–15.5)
WBC: 12.3 10*3/uL — ABNORMAL HIGH (ref 4.0–10.5)
nRBC: 0 % (ref 0.0–0.2)

## 2018-10-15 LAB — HEPARIN LEVEL (UNFRACTIONATED): Heparin Unfractionated: 0.61 IU/mL (ref 0.30–0.70)

## 2018-10-15 LAB — TROPONIN I: Troponin I: 0.22 ng/mL (ref <0.03)

## 2018-10-15 MED ORDER — FENTANYL CITRATE (PF) 100 MCG/2ML IJ SOLN
25.0000 ug | Freq: Once | INTRAMUSCULAR | Status: AC
  Administered 2018-10-15: 25 ug via INTRAVENOUS
  Filled 2018-10-15: qty 2

## 2018-10-15 MED ORDER — SIMETHICONE 80 MG PO CHEW
80.0000 mg | CHEWABLE_TABLET | Freq: Four times a day (QID) | ORAL | Status: DC | PRN
  Administered 2018-10-15 – 2018-10-16 (×2): 80 mg via ORAL
  Filled 2018-10-15 (×2): qty 1

## 2018-10-15 MED ORDER — FENTANYL CITRATE (PF) 100 MCG/2ML IJ SOLN
25.0000 ug | INTRAMUSCULAR | Status: DC | PRN
  Administered 2018-10-15 – 2018-10-16 (×2): 25 ug via INTRAVENOUS
  Filled 2018-10-15 (×2): qty 2

## 2018-10-15 NOTE — Progress Notes (Signed)
Pt received from 50M. VSS. CHG complete. Dinner tray rerouted. Pt oriented to room and unit. Will continue to monitor.  Clyde Canterbury, RN

## 2018-10-15 NOTE — Plan of Care (Signed)
  Problem: Health Behavior/Discharge Planning: Goal: Ability to manage health-related needs will improve Outcome: Progressing   Problem: Clinical Measurements: Goal: Will remain free from infection Outcome: Progressing   Problem: Activity: Goal: Risk for activity intolerance will decrease Outcome: Progressing   Problem: Elimination: Goal: Will not experience complications related to bowel motility Outcome: Progressing Goal: Will not experience complications related to urinary retention Outcome: Progressing   Problem: Safety: Goal: Ability to remain free from injury will improve Outcome: Progressing

## 2018-10-15 NOTE — Progress Notes (Signed)
Notified by CCMD that patient had a burst of 13 beat run SVT. Patient sleeping and asymptomatic. HR returned to 90's. Notified E-link of incident. Will continue to monitor

## 2018-10-15 NOTE — Progress Notes (Signed)
ANTICOAGULATION CONSULT NOTE  Pharmacy Consult:  Heparin  Indication: pulmonary embolus  No Known Allergies  Patient Measurements: Height: 6\' 2"  (188 cm) Weight: 202 lb 2.6 oz (91.7 kg) IBW/kg (Calculated) : 82.2 Heparin Dosing Weight: 95 kg  Vital Signs: Temp: 97.5 F (36.4 C) (12/05 1154) Temp Source: Oral (12/05 1154) BP: 97/75 (12/05 1200) Pulse Rate: 103 (12/05 1200)  Labs: Recent Labs    10/14/18 0900 10/14/18 0942 10/14/18 2003 10/15/18 0254  HGB  --  12.6*  --  11.6*  HCT  --  39.6  --  37.3*  PLT  --  254  --  252  APTT 34  --   --   --   LABPROT 13.3  --   --   --   INR 1.02  --   --   --   HEPARINUNFRC  --   --  0.63 0.61  CREATININE  --  0.80  --   --   TROPONINI  --   --  0.35* 0.22*    Estimated Creatinine Clearance: 111.3 mL/min (by C-G formula based on SCr of 0.8 mg/dL).   Assessment: 65 YOM with a with a recent history of prostatectomy on 09/30/18 presented to [facility] with SOB and chest pain.  CT confirmed PE with RHS and Doppler also positive for bilateral DVTs.  Pharmacy consulted to dose IV heparin.  Patient transferred to [facility] for possible lysis, but given improvement lysis was cancelled   Heparin level remains therapeutic today; no bleeding reported.  Goal of Therapy:  Heparin level 0.3-0.7 units/ml Monitor platelets by anticoagulation protocol: Yes   Plan:  Continue heparin gtt at 1600 units/hr F/U AM labs and monitor for bleeding F/u plans for transition to oral [facility]    Albertina Parr, PharmD., BCPS Clinical Pharmacist Clinical phone for 10/15/18 until 3:30pm: [phone] If after 3:30pm, please refer to [facility] for unit-specific pharmacist

## 2018-10-15 NOTE — Progress Notes (Signed)
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  He underwent laparoscopic robotic assisted radical prostatectomy 11/20 by Dr. Tresa Moore .  2 out of 4 lymph nodes were reported positive, he had a good recovery and was ambulatory.  Presented 12/4 after syncopal episode as well as c/o dyspnea. CTA confirmed bilat PE with sig clot burden.  He was tx to [facility] given high O2 requirement in anticipation of possible catheter directed lysis.   Past Medical History   has a past medical history of Anxiety, Depression, GERD (gastroesophageal reflux disease), PONV (postoperative nausea and vomiting), and Prostate cancer ([place]).  Significant Hospital Events     Consults:  IR 12/4>>>  Procedures:    Significant Diagnostic Tests:  12/4 >> CT angiogram chest -bilateral pulmonary emboli with RV/LV ratio 2.1 BLE venous doppler 12/4>>> POS bilat extensive DVT (R fem, popliteal, peroneal and L fem, popliteal, intramuscular gastrocnemius vein) 2D echo 12/4>>>normal LV function, dilated RV and RA with reduced RV function. Severe pulm HTN PASP 54mmHg   Micro Data:    Antimicrobials:     Interim history/subjective:  Feels much better than yesterday.  Feels weak, still slight dyspnea but improved.  Weaning O2, down to 2L [place].  Hemodynamically stable with slightly soft BP 90's.   Objective   Blood pressure 92/72, pulse 98, temperature (!) 97.4 F (36.3 C), temperature source Oral, resp. rate (!) 23, height 6\' 2"  (1.88 m), weight 91.7 kg, SpO2 92 %.        Intake/Output Summary (Last 24 hours) at 10/15/2018 1039 Last data filed at 10/15/2018 1000 Gross per 24 hour  Intake 2815.32 ml  Output 975 ml  Net 1840.32 ml   Filed Weights   10/14/18 1636 10/15/18 0312  Weight: 91.8 kg  91.7 kg    Examination: General: pleasant male, NAD  HENT: mm moist, no JVD  Lungs: clear Cardiovascular: s1s2 rrr Abdomen: soft, non tender  Extremities: warm and dry, BLE symmetric, no sig edema  Neuro: awake,a lert, appropriate   Resolved Hospital Problem list     Assessment & Plan:  Submassive PE with RV strain - RV/LV ratio 2.1 on CT Initially concern that he would require catheter directed lysis but held off due to recent prostatectomy. Pt now improved with conservative measures and currently no indication for more aggressive rx.   Bilat DVT  PLAN -  Continue heparin gtt per pharmacy  Supportive care  Continue trend troponin  IR following but currently no indication for catheter lysis  ?any additional intervention for extensive BLE clot burden - likely not - will d/w Dr. Valeta Harms   Best practice:  Diet: PO diet   Pain/Anxiety/Delirium protocol (if indicated): n/a VAP protocol (if indicated): n/a DVT prophylaxis: heparin gtt  GI prophylaxis: n/a Glucose control: n/a Mobility: BR Code Status: full  Family Communication: pt and son updated at length at bedside 12/5   Labs   CBC: Recent Labs  Lab 10/14/18 0942 10/15/18 0254  WBC 15.4* 12.3*  NEUTROABS 13.8*  --   HGB 12.6* 11.6*  HCT 39.6 37.3*  MCV 94.5 91.9  PLT 254 423    Basic Metabolic Panel: Recent Labs  Lab 10/14/18 0942  NA 139  K 4.8  CL 108  CO2 24  GLUCOSE 126*  BUN 27*  CREATININE 0.80  CALCIUM 8.5*   GFR: Estimated Creatinine Clearance: 111.3 mL/min (by C-G formula based on SCr of 0.8 mg/dL). Recent Labs  Lab 10/14/18 0942 10/15/18 0254  WBC 15.4* 12.3*    Liver Function Tests: Recent Labs  Lab 10/14/18 0942  AST 24  ALT 38  ALKPHOS 126  BILITOT 0.4  PROT 6.7  ALBUMIN 3.7   Recent Labs  Lab 10/14/18 0942  LIPASE 24   No results for input(s): AMMONIA in the last 168 hours.  ABG No results found for: PHART, PCO2ART, PO2ART, HCO3, TCO2, ACIDBASEDEF, O2SAT    Coagulation Profile: Recent Labs  Lab 10/14/18 0900  INR 1.02    Cardiac Enzymes: Recent Labs  Lab 10/14/18 2003 10/15/18 0254  TROPONINI 0.35* 0.22*    HbA1C: No results found for: HGBA1C  CBG: Recent Labs  Lab 10/14/18 1633 10/15/18 0804  GLUCAP 97 113*      Critical care time: [place], NP 10/15/2018  10:39 AM Pager: [phone] or [phone]

## 2018-10-16 DIAGNOSIS — R072 Precordial pain: Secondary | ICD-10-CM

## 2018-10-16 DIAGNOSIS — R7989 Other specified abnormal findings of blood chemistry: Secondary | ICD-10-CM

## 2018-10-16 LAB — PHOSPHORUS: PHOSPHORUS: 3.6 mg/dL (ref 2.5–4.6)

## 2018-10-16 LAB — BASIC METABOLIC PANEL
Anion gap: 10 (ref 5–15)
Anion gap: 11 (ref 5–15)
BUN: 18 mg/dL (ref 8–23)
BUN: 16 mg/dL (ref 8–23)
CHLORIDE: 103 mmol/L (ref 98–111)
CO2: 23 mmol/L (ref 22–32)
CO2: 23 mmol/L (ref 22–32)
CREATININE: 0.88 mg/dL (ref 0.61–1.24)
Calcium: 8.3 mg/dL — ABNORMAL LOW (ref 8.9–10.3)
Calcium: 8.3 mg/dL — ABNORMAL LOW (ref 8.9–10.3)
Chloride: 103 mmol/L (ref 98–111)
Creatinine, Ser: 0.94 mg/dL (ref 0.61–1.24)
GFR calc Af Amer: >60 mL/min (ref >60)
GFR calc Af Amer: >60 mL/min (ref >60)
GFR calc non Af Amer: >60 mL/min (ref >60)
GFR calc non Af Amer: >60 mL/min (ref >60)
Glucose, Bld: 119 mg/dL — ABNORMAL HIGH (ref 70–99)
Glucose, Bld: 111 mg/dL — ABNORMAL HIGH (ref 70–99)
POTASSIUM: 3.8 mmol/L (ref 3.5–5.1)
Potassium: 3.7 mmol/L (ref 3.5–5.1)
Sodium: 136 mmol/L (ref 135–145)
Sodium: 137 mmol/L (ref 135–145)

## 2018-10-16 LAB — CBC
HCT: 35.1 % — ABNORMAL LOW (ref 39.0–52.0)
HEMOGLOBIN: 11.3 g/dL — AB (ref 13.0–17.0)
MCH: 29.4 pg (ref 26.0–34.0)
MCHC: 32.2 g/dL (ref 30.0–36.0)
MCV: 91.2 fL (ref 80.0–100.0)
Platelets: 262 10*3/uL (ref 150–400)
RBC: 3.85 MIL/uL — AB (ref 4.22–5.81)
RDW: 13.5 % (ref 11.5–15.5)
WBC: 9.9 10*3/uL (ref 4.0–10.5)
nRBC: 0 % (ref 0.0–0.2)

## 2018-10-16 LAB — HEPARIN LEVEL (UNFRACTIONATED): Heparin Unfractionated: 0.65 IU/mL (ref 0.30–0.70)

## 2018-10-16 LAB — MAGNESIUM: Magnesium: 1.9 mg/dL (ref 1.7–2.4)

## 2018-10-16 MED ORDER — POLYETHYLENE GLYCOL 3350 17 G PO PACK
17.0000 g | PACK | Freq: Every day | ORAL | Status: DC
  Administered 2018-10-16 – 2018-10-18 (×2): 17 g via ORAL
  Filled 2018-10-16 (×3): qty 1

## 2018-10-16 MED ORDER — BISACODYL 5 MG PO TBEC: 5.0000 mg | DELAYED_RELEASE_TABLET | Freq: Every day | ORAL | Status: DC | PRN

## 2018-10-16 MED ORDER — DOCUSATE SODIUM 100 MG PO CAPS
100.0000 mg | ORAL_CAPSULE | Freq: Two times a day (BID) | ORAL | Status: DC
  Administered 2018-10-16 – 2018-10-18 (×3): 100 mg via ORAL
  Filled 2018-10-16 (×4): qty 1

## 2018-10-16 NOTE — Progress Notes (Signed)
PROGRESS NOTE    Steven Roy  UMP:[id number] DOB: [birth year] DOA: 10/14/2018 PCP: Nickola Major, MD    Brief Narrative:  62yo presented who had recent laparoscopic prostate surgery for prostate cancer presented with acute PE and acute hypoxemic failure. Patient initially admitted to [facility] service, decision not to undergo TPA given recent prostate surgery. Currently remains on heparin gtt. Transferred to [facility] service 12/6  Assessment & Plan:   Active Problems:   Pulmonary embolism (HCC)   PE (pulmonary thromboembolism) ([place])   1. Submassive PE with RV strain with B DVT 1. No TPA given as pt had recent prostate surgery 2. Continued on heparin gtt 3. Currently on Alsen 4. Likely transition to eliquis if stable in the next 24hrs 5. Have consulted PT/OT 2. Prostate cancer 1. S/p recent prostate surgery per Urology 2. Will need close outpatient follow up 3. GERD 1. Seems stable at this time  DVT prophylaxis: Heparin gtt Code Status: Full Family Communication: Pt in room, family not at bedside Disposition Plan: Uncertain at this time  Consultants:   CCM  Procedures:     Antimicrobials: Anti-infectives (From admission, onward)   None       Subjective: Still feeling sob, also weak  Objective: Vitals:   10/16/18 0700 10/16/18 0900 10/16/18 1100 10/16/18 1300  BP:      Pulse:      Resp: (!) 24 (!) 26 (!) 23 (!) 28  Temp:      TempSrc:      SpO2: 92% 97% 91% 97%  Weight:      Height:        Intake/Output Summary (Last 24 hours) at 10/16/2018 1509 Last data filed at 10/16/2018 1230 Gross per 24 hour  Intake 741.87 ml  Output 1150 ml  Net -408.13 ml   Filed Weights   10/14/18 1636 10/15/18 0312  Weight: 91.8 kg 91.7 kg    Examination:  General exam: Appears somewhat uncomfortable and sob Respiratory system: Clear to auscultation. Increased resp effort Cardiovascular system: S1 & S2 heard, RRR Gastrointestinal system: Abdomen is nondistended, soft  and nontender. No organomegaly or masses felt. Normal bowel sounds heard. Central nervous system: Alert and oriented. No focal neurological deficits. Extremities: Symmetric 5 x 5 power. Skin: No rashes, lesions  Psychiatry: Judgement and insight appear normal. Mood & affect appropriate.   Data Reviewed: I have personally reviewed following labs and imaging studies  CBC: Recent Labs  Lab 10/14/18 0942 10/15/18 0254 10/16/18 0348  WBC 15.4* 12.3* 9.9  NEUTROABS 13.8*  --   --   HGB 12.6* 11.6* 11.3*  HCT 39.6 37.3* 35.1*  MCV 94.5 91.9 91.2  PLT 254 252 400   Basic Metabolic Panel: Recent Labs  Lab 10/14/18 0942 10/16/18 0044 10/16/18 0348  NA 139 136 137  K 4.8 3.7 3.8  CL 108 103 103  CO2 24 23 23   GLUCOSE 126* 119* 111*  BUN 27* 18 16  CREATININE 0.80 0.88 0.94  CALCIUM 8.5* 8.3* 8.3*  MG  --  1.9  --   PHOS  --  3.6  --    GFR: Estimated Creatinine Clearance: 94.7 mL/min (by C-G formula based on SCr of 0.94 mg/dL). Liver Function Tests: Recent Labs  Lab 10/14/18 0942  AST 24  ALT 38  ALKPHOS 126  BILITOT 0.4  PROT 6.7  ALBUMIN 3.7   Recent Labs  Lab 10/14/18 0942  LIPASE 24   No results for input(s): AMMONIA in the last 168  hours. Coagulation Profile: Recent Labs  Lab 10/14/18 0900  INR 1.02   Cardiac Enzymes: Recent Labs  Lab 10/14/18 2003 10/15/18 0254  TROPONINI 0.35* 0.22*   BNP (last 3 results) No results for input(s): PROBNP in the last 8760 hours. HbA1C: No results for input(s): HGBA1C in the last 72 hours. CBG: Recent Labs  Lab 10/14/18 1633 10/15/18 0804  GLUCAP 97 113*   Lipid Profile: No results for input(s): CHOL, HDL, LDLCALC, TRIG, CHOLHDL, LDLDIRECT in the last 72 hours. Thyroid Function Tests: No results for input(s): TSH, T4TOTAL, FREET4, T3FREE, THYROIDAB in the last 72 hours. Anemia Panel: No results for input(s): VITAMINB12, FOLATE, FERRITIN, TIBC, IRON, RETICCTPCT in the last 72 hours. Sepsis Labs: No  results for input(s): PROCALCITON, LATICACIDVEN in the last 168 hours.  Recent Results (from the past 240 hour(s))  MRSA PCR Screening     Status: None   Collection Time: 10/14/18  4:27 PM  Result Value Ref Range Status   MRSA by PCR NEGATIVE NEGATIVE Final    Comment:        The GeneXpert MRSA Assay (FDA approved for NASAL specimens only), is one component of a comprehensive MRSA colonization surveillance program. It is not intended to diagnose MRSA infection nor to guide or monitor treatment for MRSA infections. Performed at [facility], [place] [address] [place], [place] [address]      Radiology Studies: No results found.  Scheduled Meds: . docusate sodium  100 mg Oral BID  . mouth rinse  15 mL Mouth Rinse BID  . pantoprazole  40 mg Oral Daily  . polyethylene glycol  17 g Oral Daily   Continuous Infusions: . dextrose 5 % and 0.45% NaCl Stopped (10/15/18 1541)  . heparin 1,600 Units/hr (10/16/18 0259)  . sodium chloride       LOS: 2 days   Marylu Lund, MD [facility] Pager On Amion  If 7PM-7AM, please contact night-coverage 10/16/2018, 3:09 PM

## 2018-10-16 NOTE — Progress Notes (Signed)
ANTICOAGULATION CONSULT NOTE  Pharmacy Consult:  Heparin  Indication: pulmonary embolus  No Known Allergies  Patient Measurements: Height: 6\' 2"  (188 cm) Weight: 202 lb 2.6 oz (91.7 kg) IBW/kg (Calculated) : 82.2 Heparin Dosing Weight: 95 kg  Vital Signs: Temp: 98.1 F (36.7 C) (12/06 0655) Temp Source: Oral (12/06 0655) BP: 102/74 (12/06 0655)  Labs: Recent Labs    10/14/18 0900  10/14/18 0942 10/14/18 2003 10/15/18 0254 10/16/18 0044 10/16/18 0348  HGB  --    < > 12.6*  --  11.6*  --  11.3*  HCT  --   --  39.6  --  37.3*  --  35.1*  PLT  --   --  254  --  252  --  262  APTT 34  --   --   --   --   --   --   LABPROT 13.3  --   --   --   --   --   --   INR 1.02  --   --   --   --   --   --   HEPARINUNFRC  --   --   --  0.63 0.61  --  0.65  CREATININE  --   --  0.80  --   --  0.88 0.94  TROPONINI  --   --   --  0.35* 0.22*  --   --    < > = values in this interval not displayed.    Estimated Creatinine Clearance: 94.7 mL/min (by C-G formula based on SCr of 0.94 mg/dL).   Assessment: 65 YOM with a with a recent history of prostatectomy on 09/30/18 presented to [facility] with SOB and chest pain.  CT confirmed PE with RHS and Doppler also positive for bilateral DVTs.  Pharmacy consulted to dose IV heparin.  Patient transferred to [facility] for possible lysis, but given improvement lysis was cancelled   Heparin level remains therapeutic at 0.65 today; no bleeding reported.  Goal of Therapy:  Heparin level 0.3-0.7 units/ml Monitor platelets by anticoagulation protocol: Yes   Plan:  Continue heparin gtt at 1600 units/hr F/U AM labs and monitor for bleeding F/u plans for transition to oral [facility]    Alanda Slim, PharmD, [facility] Clinical Pharmacist Please see AMION for all Pharmacists' Contact Phone Numbers 10/16/2018, 2:38 PM

## 2018-10-16 NOTE — Progress Notes (Signed)
Notified E-link that patient had another episode of 10 beat SVT. VSS. Patient sleeping and asymptomatic. Heart rate returned to 90's. Will continue to monitor

## 2018-10-16 NOTE — Progress Notes (Signed)
Patient had a burst of 48 beat SVT. Patient resting at the time. Asymptomatic. Notified on call

## 2018-10-16 NOTE — Evaluation (Signed)
Physical Therapy Evaluation Patient Details Name: Steven Roy MRN: [id number] DOB: 07-Aug-1956 Today's Date: 10/16/2018   History of Present Illness  Pt is a 62 y.o. M with recent laparoscopic prostate surgery for prostate cancer who presents with acute PE and bilateral DVT and acute hypoxemic failure.  Clinical Impression  Pt admitted with above diagnosis. Prior to admission, patient independent and is a Librarian, academic for [employer]. On PT evaluation, patient presenting with decreased endurance. Ambulating 150 feet with Rollator without physical assistance (Rollator primarily utilized for if patient needed to take a seated break). SpO2 97% on 2L at rest, decrease to 83% SpO2 on RA with room ambulation, returned to 2L O2 and maintained > 90%. Education provided on endurance conservation/strategies. Will continue to follow acutely to progress mobility. No PT follow up or equipment recommended.       Follow Up Recommendations No PT follow up    Equipment Recommendations  None recommended by PT    Recommendations for Other Services       Precautions / Restrictions Precautions Precautions: Other (comment) Precaution Comments: watch O2 Restrictions Weight Bearing Restrictions: No      Mobility  Bed Mobility Overal bed mobility: Independent                Transfers Overall transfer level: Independent Equipment used: None                Ambulation/Gait Ambulation/Gait assistance: Modified independent (Device/Increase time) Gait Distance (Feet): 150 Feet Assistive device: 4-wheeled walker Gait Pattern/deviations: WFL(Within Functional Limits)     General Gait Details: Patient with good posture and gait speed. Cues for self pacing and pursed lip breathing  Stairs            Wheelchair Mobility    Modified Rankin (Stroke Patients Only)       Balance Overall balance assessment: No apparent balance deficits (not formally assessed)                                            Pertinent Vitals/Pain Pain Assessment: No/denies pain  Vitals: BP 121/82, HR low 100s    Home Living Family/patient expects to be discharged to:: Private residence Living Arrangements: Children(son) Available Help at Discharge: Family Type of Home: Other(Comment)(condo) Home Access: Level entry     Home Layout: One level Home Equipment: Toilet riser Additional Comments: states he has access to a lot of equipment through his mother in law     Prior Function Level of Independence: Independent         Comments: 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 Since recent prostate surgery, pt stating he was averaging 5,000 steps per day     Hand Dominance        Extremity/Trunk Assessment   Upper Extremity Assessment Upper Extremity Assessment: Overall WFL for tasks assessed    Lower Extremity Assessment Lower Extremity Assessment: Overall WFL for tasks assessed    Cervical / Trunk Assessment Cervical / Trunk Assessment: Normal  Communication   Communication: No difficulties  Cognition Arousal/Alertness: Awake/alert Behavior During Therapy: WFL for tasks assessed/performed Overall Cognitive Status: Within Functional Limits for tasks assessed                                        General  Comments      Exercises     Assessment/Plan    PT Assessment Patient needs continued PT services  PT Problem List Decreased activity tolerance;Decreased mobility;Cardiopulmonary status limiting activity       PT Treatment Interventions Gait training;Stair training;Functional mobility training;Therapeutic activities;Therapeutic exercise;Balance training;Patient/family education    PT Goals (Current goals can be found in the Care Plan section)  Acute Rehab PT Goals Patient Stated Goal: "walk more." PT Goal Formulation: With patient Time For Goal Achievement: 10/30/18 Potential to Achieve Goals: Good     Frequency Min 3X/week   Barriers to discharge        Co-evaluation               AM-PAC PT "6 Clicks" Mobility  Outcome Measure Help needed turning from your back to your side while in a flat bed without using bedrails?: None Help needed moving from lying on your back to sitting on the side of a flat bed without using bedrails?: None Help needed moving to and from a bed to a chair (including a wheelchair)?: None Help needed standing up from a chair using your arms (e.g., wheelchair or bedside chair)?: None Help needed to walk in hospital room?: None Help needed climbing 3-5 steps with a railing? : A Little 6 Click Score: 23    End of Session Equipment Utilized During Treatment: Gait belt;Oxygen Activity Tolerance: Patient tolerated treatment well Patient left: in chair;with call bell/phone within reach Nurse Communication: Mobility status PT Visit Diagnosis: Difficulty in walking, not elsewhere classified (R26.2)    Time: 1431-1500 PT Time Calculation (min) (ACUTE ONLY): 29 min   Charges:   PT Evaluation $PT Eval Moderate Complexity: 1 Mod PT Treatments $Therapeutic Activity: 8-22 mins       Ellamae Sia, PT, DPT Acute Rehabilitation Services Pager [phone] Office [phone]   Willy Eddy 10/16/2018, 4:47 PM

## 2018-10-16 NOTE — Plan of Care (Signed)
  Problem: Health Behavior/Discharge Planning: Goal: Ability to manage health-related needs will improve Outcome: Progressing   Problem: Clinical Measurements: Goal: Will remain free from infection Outcome: Progressing Goal: Cardiovascular complication will be avoided Outcome: Progressing   Problem: Activity: Goal: Risk for activity intolerance will decrease Outcome: Progressing   Problem: Nutrition: Goal: Adequate nutrition will be maintained Outcome: Progressing   Problem: Elimination: Goal: Will not experience complications related to bowel motility Outcome: Progressing Goal: Will not experience complications related to urinary retention Outcome: Progressing   Problem: Pain Managment: Goal: General experience of comfort will improve Outcome: Progressing   Problem: Safety: Goal: Ability to remain free from injury will improve Outcome: Progressing

## 2018-10-16 NOTE — Progress Notes (Signed)
Patient doing well with IV heparin therapy for PE currently - will cancel order in Epic for possible PE lysis in IR.  Please place order again if PE lysis is needed in the future. IR available.   Please call IR with questions or concerns.  Candiss Norse, PA-C Pager# [phone]

## 2018-10-16 NOTE — Progress Notes (Signed)
Patient ambulated in the hallway about 1200 ft with a front wheel walker. Patient ambulated with 4 liters of O2. Patient tolerated well. O2 after ambulation was 90% and within 30 seconds patient's O2 was 95-96% on 4 liters at rest. Nurse and NA walked along side of patient.   Emelda Fear, RN

## 2018-10-17 DIAGNOSIS — R0902 Hypoxemia: Secondary | ICD-10-CM

## 2018-10-17 LAB — CBC
HCT: 35.3 % — ABNORMAL LOW (ref 39.0–52.0)
Hemoglobin: 11.4 g/dL — ABNORMAL LOW (ref 13.0–17.0)
MCH: 29.2 pg (ref 26.0–34.0)
MCHC: 32.3 g/dL (ref 30.0–36.0)
MCV: 90.5 fL (ref 80.0–100.0)
Platelets: 275 10*3/uL (ref 150–400)
RBC: 3.9 MIL/uL — ABNORMAL LOW (ref 4.22–5.81)
RDW: 13.2 % (ref 11.5–15.5)
WBC: 8.4 10*3/uL (ref 4.0–10.5)
nRBC: 0 % (ref 0.0–0.2)

## 2018-10-17 LAB — HEPARIN LEVEL (UNFRACTIONATED): HEPARIN UNFRACTIONATED: 0.53 [IU]/mL (ref 0.30–0.70)

## 2018-10-17 MED ORDER — APIXABAN 5 MG PO TABS
10.0000 mg | ORAL_TABLET | Freq: Two times a day (BID) | ORAL | Status: DC
  Administered 2018-10-17 – 2018-10-18 (×3): 10 mg via ORAL
  Filled 2018-10-17: qty 2
  Filled 2018-10-17: qty 4
  Filled 2018-10-17 (×2): qty 2

## 2018-10-17 MED ORDER — APIXABAN 5 MG PO TABS: 5.0000 mg | ORAL_TABLET | Freq: Two times a day (BID) | ORAL | Status: DC

## 2018-10-17 NOTE — Discharge Instructions (Signed)
Information on my medicine - ELIQUIS (apixaban)  This medication education was reviewed with me or my healthcare representative as part of my discharge preparation.  Why was Eliquis prescribed for you? Eliquis was prescribed to treat blood clots that may have been found in the veins of your legs (deep vein thrombosis) or in your lungs (pulmonary embolism) and to reduce the risk of them occurring again.  What do You need to know about Eliquis ? The starting dose is 10 mg (two 5 mg tablets) taken TWICE daily for the FIRST SEVEN (7) DAYS, then on (enter date)  10/24/2018  the dose is reduced to ONE 5 mg tablet taken TWICE daily.  Eliquis may be taken with or without food.   Try to take the dose about the same time in the morning and in the evening. If you have difficulty swallowing the tablet whole please discuss with your pharmacist how to take the medication safely.  Take Eliquis exactly as prescribed and DO NOT stop taking Eliquis without talking to the doctor who prescribed the medication.  Stopping may increase your risk of developing a new blood clot.  Refill your prescription before you run out.  After discharge, you should have regular check-up appointments with your healthcare provider that is prescribing your Eliquis.    What do you do if you miss a dose? If a dose of ELIQUIS is not taken at the scheduled time, take it as soon as possible on the same day and twice-daily administration should be resumed. The dose should not be doubled to make up for a missed dose.  Important Safety Information A possible side effect of Eliquis is bleeding. You should call your healthcare provider right away if you experience any of the following: ? Bleeding from an injury or your nose that does not stop. ? Unusual colored urine (red or dark brown) or unusual colored stools (red or black). ? Unusual bruising for unknown reasons. ? A serious fall or if you hit your head (even if there is no  bleeding).  Some medicines may interact with Eliquis and might increase your risk of bleeding or clotting while on Eliquis. To help avoid this, consult your healthcare provider or pharmacist prior to using any new prescription or non-prescription medications, including herbals, vitamins, non-steroidal anti-inflammatory drugs (NSAIDs) and supplements.  This website has more information on Eliquis (apixaban): http://[email or web address].com/eliquis/home

## 2018-10-17 NOTE — Progress Notes (Signed)
ANTICOAGULATION CONSULT NOTE  Pharmacy Consult:  Heparin >> Apixaban Indication: pulmonary embolus and DVT  No Known Allergies  Patient Measurements: Height: 6\' 2"  (188 cm) Weight: 201 lb 1.6 oz (91.2 kg) IBW/kg (Calculated) : 82.2 Heparin Dosing Weight: 95 kg  Vital Signs: Temp: 97.8 F (36.6 C) (12/07 0422) Temp Source: Oral (12/07 0422) BP: 122/84 (12/07 0422) Pulse Rate: 91 (12/07 0004)  Labs: Recent Labs    10/14/18 0942  10/14/18 2003 10/15/18 0254 10/16/18 0044 10/16/18 0348 10/17/18 0330  HGB 12.6*  --   --  11.6*  --  11.3* 11.4*  HCT 39.6  --   --  37.3*  --  35.1* 35.3*  PLT 254  --   --  252  --  262 275  HEPARINUNFRC  --    < > 0.63 0.61  --  0.65 0.53  CREATININE 0.80  --   --   --  0.88 0.94  --   TROPONINI  --   --  0.35* 0.22*  --   --   --    < > = values in this interval not displayed.    Estimated Creatinine Clearance: 94.7 mL/min (by C-G formula based on SCr of 0.94 mg/dL).   Assessment: 62 yo M with a with a recent history of prostatectomy on 09/30/18 presented to [facility] with SOB and chest pain.  CT confirmed PE with RHS and Doppler also positive for bilateral DVTs.  Pharmacy initially consulted to dose IV heparin.  To transition to PO Apixaban today.  Hgb stable, No bleeding noted.  Goal of Therapy:  Therapeutic Anticoagulation Monitor platelets by anticoagulation protocol: Yes   Plan:  Discontinue heparin infusion at daily labs. Start apixaban 10mg  PO BID x 7 days followed by 5mg  BID thereafter. Monitor for bleeding. Apixaban education to follow.  [employer], Pharm.D., BCPS Clinical Pharmacist  **Pharmacist phone directory can now be found on [email or web address] ([email or web address]).  Listed under [place].  10/17/2018 9:08 AM

## 2018-10-17 NOTE — Progress Notes (Signed)
PROGRESS NOTE    Steven Roy  MVE:[id number] DOB: [birth year] DOA: 10/14/2018 PCP: Nickola Major, MD    Brief Narrative:  62yo presented who had recent laparoscopic prostate surgery for prostate cancer presented with acute PE and acute hypoxemic failure. Patient initially admitted to [facility] service, decision not to undergo TPA given recent prostate surgery. Currently remains on heparin gtt. Transferred to [facility] service 12/6  Assessment & Plan:   Active Problems:   Pulmonary embolism (HCC)   PE (pulmonary thromboembolism) ([place])   1. Submassive PE with RV strain with B DVT 1. No TPA given as pt had recent prostate surgery 2. Had been continued on heparin gtt 3. O2 requirements improving - had been down to [facility] this AM, currently RA 4. Transition to eliquis today 5. No PT or OT needs noted 6. Given recent prostate surgery, will monitor on eliquis for acute life threatening bleed 2. Prostate cancer 1. S/p recent prostate surgery per Urology 2. Will need close outpatient follow up 3. GERD 1. Currently stable  DVT prophylaxis: eliquis Code Status: Full Family Communication: Pt in room, family not at bedside Disposition Plan: Uncertain at this time  Consultants:   CCM  Procedures:     Antimicrobials: Anti-infectives (From admission, onward)   None      Subjective: Feeling better. Still sob  Objective: Vitals:   10/16/18 2031 10/17/18 0004 10/17/18 0422 10/17/18 1304  BP: 127/88 110/75 122/84 122/74  Pulse:  91  97  Resp: (!) 29 (!) 22 19 (!) 23  Temp: 98 F (36.7 C) 97.9 F (36.6 C) 97.8 F (36.6 C) 98 F (36.7 C)  TempSrc:  Oral Oral Oral  SpO2: 93% 93% 96% 92%  Weight:   91.2 kg   Height:        Intake/Output Summary (Last 24 hours) at 10/17/2018 1629 Last data filed at 10/17/2018 0900 Gross per 24 hour  Intake 633.35 ml  Output 400 ml  Net 233.35 ml   Filed Weights   10/14/18 1636 10/15/18 0312 10/17/18 0422  Weight: 91.8 kg 91.7 kg 91.2 kg      Examination: General exam: Awake, laying in bed, in nad Respiratory system: Normal respiratory effort, no wheezing Cardiovascular system: regular rate, s1, s2 Gastrointestinal system: Soft, nondistended, positive BS Central nervous system: CN2-12 grossly intact, strength intact Extremities: Perfused, no clubbing Skin: Normal skin turgor, no notable skin lesions seen Psychiatry: Mood normal // no visual hallucinations   Data Reviewed: I have personally reviewed following labs and imaging studies  CBC: Recent Labs  Lab 10/14/18 0942 10/15/18 0254 10/16/18 0348 10/17/18 0330  WBC 15.4* 12.3* 9.9 8.4  NEUTROABS 13.8*  --   --   --   HGB 12.6* 11.6* 11.3* 11.4*  HCT 39.6 37.3* 35.1* 35.3*  MCV 94.5 91.9 91.2 90.5  PLT 254 252 262 283   Basic Metabolic Panel: Recent Labs  Lab 10/14/18 0942 10/16/18 0044 10/16/18 0348  NA 139 136 137  K 4.8 3.7 3.8  CL 108 103 103  CO2 24 23 23   GLUCOSE 126* 119* 111*  BUN 27* 18 16  CREATININE 0.80 0.88 0.94  CALCIUM 8.5* 8.3* 8.3*  MG  --  1.9  --   PHOS  --  3.6  --    GFR: Estimated Creatinine Clearance: 94.7 mL/min (by C-G formula based on SCr of 0.94 mg/dL). Liver Function Tests: Recent Labs  Lab 10/14/18 0942  AST 24  ALT 38  ALKPHOS 126  BILITOT 0.4  PROT 6.7  ALBUMIN 3.7   Recent Labs  Lab 10/14/18 0942  LIPASE 24   No results for input(s): AMMONIA in the last 168 hours. Coagulation Profile: Recent Labs  Lab 10/14/18 0900  INR 1.02   Cardiac Enzymes: Recent Labs  Lab 10/14/18 2003 10/15/18 0254  TROPONINI 0.35* 0.22*   BNP (last 3 results) No results for input(s): PROBNP in the last 8760 hours. HbA1C: No results for input(s): HGBA1C in the last 72 hours. CBG: Recent Labs  Lab 10/14/18 1633 10/15/18 0804  GLUCAP 97 113*   Lipid Profile: No results for input(s): CHOL, HDL, LDLCALC, TRIG, CHOLHDL, LDLDIRECT in the last 72 hours. Thyroid Function Tests: No results for input(s): TSH, T4TOTAL,  FREET4, T3FREE, THYROIDAB in the last 72 hours. Anemia Panel: No results for input(s): VITAMINB12, FOLATE, FERRITIN, TIBC, IRON, RETICCTPCT in the last 72 hours. Sepsis Labs: No results for input(s): PROCALCITON, LATICACIDVEN in the last 168 hours.  Recent Results (from the past 240 hour(s))  MRSA PCR Screening     Status: None   Collection Time: 10/14/18  4:27 PM  Result Value Ref Range Status   MRSA by PCR NEGATIVE NEGATIVE Final    Comment:        The GeneXpert MRSA Assay (FDA approved for NASAL specimens only), is one component of a comprehensive MRSA colonization surveillance program. It is not intended to diagnose MRSA infection nor to guide or monitor treatment for MRSA infections. Performed at [facility], [place] [address], [place], [place] [address]      Radiology Studies: No results found.  Scheduled Meds: . apixaban  10 mg Oral BID   Followed by  . [START ON 10/24/2018] apixaban  5 mg Oral BID  . docusate sodium  100 mg Oral BID  . mouth rinse  15 mL Mouth Rinse BID  . pantoprazole  40 mg Oral Daily  . polyethylene glycol  17 g Oral Daily   Continuous Infusions: . dextrose 5 % and 0.45% NaCl Stopped (10/15/18 1541)  . sodium chloride       LOS: 3 days   Marylu Lund, MD [facility] Pager On Amion  If 7PM-7AM, please contact night-coverage 10/17/2018, 4:29 PM

## 2018-10-17 NOTE — Progress Notes (Signed)
OT Cancellation Note  Patient Details Name: Steven Roy MRN: [id number] DOB: [birth year]   Cancelled Treatment:    Reason Eval/Treat Not Completed: OT screened, he is walking without O2, showering independently, no needs identified, will sign off.  [place] 10/17/2018, 3:45 PM  Hulda Humphrey OTR/L Acute Rehabilitation Services Pager: [phone] Office: [phone]

## 2018-10-18 MED ORDER — APIXABAN 5 MG PO TABS: 10.0000 mg | ORAL_TABLET | Freq: Two times a day (BID) | ORAL | 0 refills | Status: DC

## 2018-10-18 MED ORDER — DOCUSATE SODIUM 100 MG PO CAPS: 100.0000 mg | ORAL_CAPSULE | Freq: Two times a day (BID) | ORAL | 0 refills | Status: AC

## 2018-10-18 MED ORDER — PANTOPRAZOLE SODIUM 40 MG PO TBEC: 40.0000 mg | DELAYED_RELEASE_TABLET | Freq: Every day | ORAL | 0 refills | Status: DC

## 2018-10-18 MED ORDER — APIXABAN 5 MG PO TABS: 5.0000 mg | ORAL_TABLET | Freq: Two times a day (BID) | ORAL | 0 refills | Status: DC

## 2018-10-18 NOTE — Progress Notes (Signed)
Pt ambulated independent 500+ feet down the hallway on room air. Sats 100% at rest, 96-100% during ambulation. Tolerated well.

## 2018-10-18 NOTE — Discharge Summary (Signed)
Physician Discharge Summary  Steven Roy TTS:[id number] DOB: [birth year] DOA: 10/14/2018  PCP: Nickola Major, MD  Admit date: 10/14/2018 Discharge date: 10/18/2018  Admitted From: Home Disposition:  home  Recommendations for Outpatient Follow-up:  1. Follow up with PCP in 1-2 weeks 2. Follow up with Urology as scheduled  Discharge Condition:Improved CODE STATUS:Full Diet recommendation: Regular   Brief/Interim Summary: 62yo presented who had recent laparoscopic prostate surgery for prostate cancer presented with acute PE and acute hypoxemic failure. Patient initially admitted to [facility] service, decision not to undergo TPA given recent prostate surgery. Currently remains on heparin gtt. Transferred to [facility] service 12/6  Discharge Diagnoses:  Active Problems:   Pulmonary embolism (HCC)   PE (pulmonary thromboembolism) ([place])  1. Submassive PE with RV strain with B DVT 1. No TPA given as pt had recent prostate surgery 2. Initially continued on heparin gtt 3. O2 requirements improving, weaned to room air 4. Transition to eliquis today 5. No PT or OT needs noted 2. Prostate cancer 1. S/p recent prostate surgery per Urology 2. Will need close outpatient follow up 3. GERD 1. Currently stable   Discharge Instructions   Allergies as of 10/18/2018   No Known Allergies     Medication List    STOP taking these medications   HYDROcodone-acetaminophen 5-325 MG tablet Commonly known as:  NORCO/VICODIN   senna-docusate 8.6-50 MG tablet Commonly known as:  Senokot-S   sulfamethoxazole-trimethoprim 800-160 MG tablet Commonly known as:  BACTRIM DS,SEPTRA DS     TAKE these medications   apixaban 5 MG Tabs tablet Commonly known as:  ELIQUIS Take 2 tablets (10 mg total) by mouth 2 (two) times daily for 8 days. Take 10mg  twice daily from evening of 10/19/18 for 8 days, then start 5mg  bid dosing afterwards Start taking on:  10/19/2018   apixaban 5 MG Tabs tablet Commonly known  as:  ELIQUIS Take 1 tablet (5 mg total) by mouth 2 (two) times daily. Start after finishing 10mg  bid dosing Start taking on:  10/24/2018   atorvastatin 80 MG tablet Commonly known as:  LIPITOR Take 80 mg by mouth daily.   docusate sodium 100 MG capsule Commonly known as:  COLACE Take 1 capsule (100 mg total) by mouth 2 (two) times daily.   ferrous sulfate 325 (65 FE) MG EC tablet Take 325 mg by mouth 2 (two) times a week.   MENS MULTIVITAMIN PLUS Tabs Take 1 tablet by mouth daily.   pantoprazole 40 MG tablet Commonly known as:  PROTONIX Take 1 tablet (40 mg total) by mouth daily.   ranitidine 150 MG tablet Commonly known as:  ZANTAC Take 150 mg by mouth 2 (two) times daily as needed for heartburn.      Follow-up Information    Nickola Major, MD. Schedule an appointment as soon as possible for a visit in 1 week(s).   Specialty:  Family Medicine Contact information: [address] [place] [address] [phone]          No Known Allergies  Consultations:  Pulmonary  Procedures/Studies: Dg Chest 2 View  Result Date: 10/14/2018 CLINICAL DATA:  Syncopal episode and shortness of breath. EXAM: CHEST - 2 VIEW COMPARISON:  None. FINDINGS: The cardiac silhouette, mediastinal and hilar contours are within normal limits. The lungs are clear. Mild eventration of the right hemidiaphragm. No pleural effusion or pneumothorax. The bony thorax is intact. No obvious rib fractures. IMPRESSION: No acute cardiopulmonary findings and intact bony thorax. Electronically Signed  By: Marijo Sanes M.D.   On: 10/14/2018 10:03   Ct Angio Chest Pe W And/or Wo Contrast  Result Date: 10/14/2018 CLINICAL DATA:  Recent surgery. Severe dyspnea since 3 a.m. this morning. Hypoxia. EXAM: CT ANGIOGRAPHY CHEST WITH CONTRAST TECHNIQUE: Multidetector CT imaging of the chest was performed using the standard protocol during bolus administration of intravenous contrast. Multiplanar CT image  reconstructions and MIPs were obtained to evaluate the vascular anatomy. CONTRAST:  131mL ISOVUE-370 IOPAMIDOL (ISOVUE-370) INJECTION 76% COMPARISON:  Chest radiograph from earlier today. FINDINGS: Cardiovascular: The study is high quality for the evaluation of pulmonary embolism. There is a large burden of acute pulmonary embolism bilaterally involving the lobar, segmental and subsegmental branches of all lung lobes. The most proximal clot is located within the left pulmonary artery. No saddle emboli. Great vessels are normal in course and caliber. Normal heart size. No significant pericardial fluid/thickening. RV LV ratio 2.1. Mediastinum/Nodes: No discrete thyroid nodules. Unremarkable esophagus. No pathologically enlarged axillary, mediastinal or hilar lymph nodes. Lungs/Pleura: No pneumothorax. No pleural effusion. Patchy ground-glass opacity in the right middle and left lower lobes. No lung masses or significant pulmonary nodules. Upper abdomen: Small amount of contrast reflux into the IVC and hepatic veins. Musculoskeletal:  No aggressive appearing focal osseous lesions. Review of the MIP images confirms the above findings. IMPRESSION: 1. Acute bilateral pulmonary embolism with large clot burden. Most proximal clot is located within the left pulmonary artery. No saddle emboli. 2. Positive for acute PE with CT evidence of right heart strain (RV/LV Ratio = 2.1) consistent with at least submassive (intermediate risk) PE. The presence of right heart strain has been associated with an increased risk of morbidity and mortality. Please activate Code PE by paging [phone]. 3. Patchy ground-glass opacity in the right middle and left lower lobes, nonspecific, which could represent alveolar hemorrhage related to developing pulmonary infarcts versus aspiration. Critical Value/emergent results were called by telephone at the time of interpretation on 10/14/2018 at 11:10 am to Dr. Marda Stalker , who verbally  acknowledged these results. Electronically Signed   By: Ilona Sorrel M.D.   On: 10/14/2018 11:18   Vas [place] Lower Extremity Venous (dvt)  Result Date: 10/14/2018  Lower Venous Study Indications: Pulmonary embolism.  Performing Technologist: Oliver Hum RVT  Examination Guidelines: A complete evaluation includes B-mode imaging, spectral Doppler, color Doppler, and power Doppler as needed of all accessible portions of each vessel. Bilateral testing is considered an integral part of a complete examination. Limited examinations for reoccurring indications may be performed as noted.  Right Venous Findings: +---------+---------------+---------+-----------+----------+-------+          CompressibilityPhasicitySpontaneityPropertiesSummary +---------+---------------+---------+-----------+----------+-------+ CFV      Full           Yes      Yes                          +---------+---------------+---------+-----------+----------+-------+ SFJ      Full                                                 +---------+---------------+---------+-----------+----------+-------+ FV Prox  Full                                                 +---------+---------------+---------+-----------+----------+-------+  FV Mid   None           No       No                   Acute   +---------+---------------+---------+-----------+----------+-------+ FV DistalFull                                                 +---------+---------------+---------+-----------+----------+-------+ PFV      Full                                                 +---------+---------------+---------+-----------+----------+-------+ POP      Partial        No       No                   Acute   +---------+---------------+---------+-----------+----------+-------+ PTV      Full                                                 +---------+---------------+---------+-----------+----------+-------+ PERO     None                                          Acute   +---------+---------------+---------+-----------+----------+-------+  Left Venous Findings: +---------+---------------+---------+-----------+----------+-------+          CompressibilityPhasicitySpontaneityPropertiesSummary +---------+---------------+---------+-----------+----------+-------+ CFV      Full           Yes      Yes                          +---------+---------------+---------+-----------+----------+-------+ SFJ      Full                                                 +---------+---------------+---------+-----------+----------+-------+ FV Prox  Full                                                 +---------+---------------+---------+-----------+----------+-------+ FV Mid   Full                                                 +---------+---------------+---------+-----------+----------+-------+ FV DistalPartial        No       No                   Acute   +---------+---------------+---------+-----------+----------+-------+ PFV      Full                                                 +---------+---------------+---------+-----------+----------+-------+  POP      Partial        No       No                   Acute   +---------+---------------+---------+-----------+----------+-------+ PTV      Full                                                 +---------+---------------+---------+-----------+----------+-------+ PERO     Full                                                 +---------+---------------+---------+-----------+----------+-------+ Gastroc  None                                         Acute   +---------+---------------+---------+-----------+----------+-------+    Summary: Right: Findings consistent with acute deep vein thrombosis involving the right femoral vein, right popliteal vein, and right peroneal vein. No cystic structure found in the popliteal fossa. Left: Findings  consistent with acute deep vein thrombosis involving the left femoral vein, left popliteal vein, and left gastrocnemius vein.  *See table(s) above for measurements and observations. Electronically signed by Quay Burow MD on 10/14/2018 at 4:46:52 PM.    Final      Subjective: Eager to go home  Discharge Exam: Vitals:   10/18/18 0437 10/18/18 0558  BP: 119/80   Pulse:    Resp: (!) 22 19  Temp: 97.8 F (36.6 C)   SpO2: 96%    Vitals:   10/17/18 1942 10/18/18 0023 10/18/18 0437 10/18/18 0558  BP: 118/80 111/83 119/80   Pulse:      Resp: (!) 24 (!) 26 (!) 22 19  Temp: 98.4 F (36.9 C) 98.2 F (36.8 C) 97.8 F (36.6 C)   TempSrc: Oral Oral Oral   SpO2: 96% 96% 96%   Weight:    90.7 kg  Height:        General: Pt is alert, awake, not in acute distress Cardiovascular: RRR, S1/S2 +, no rubs, no gallops Respiratory: CTA bilaterally, no wheezing, no rhonchi Abdominal: Soft, NT, ND, bowel sounds + Extremities: no edema, no cyanosis   The results of significant diagnostics from this hospitalization (including imaging, microbiology, ancillary and laboratory) are listed below for reference.     Microbiology: Recent Results (from the past 240 hour(s))  MRSA PCR Screening     Status: None   Collection Time: 10/14/18  4:27 PM  Result Value Ref Range Status   MRSA by PCR NEGATIVE NEGATIVE Final    Comment:        The GeneXpert MRSA Assay (FDA approved for NASAL specimens only), is one component of a comprehensive MRSA colonization surveillance program. It is not intended to diagnose MRSA infection nor to guide or monitor treatment for MRSA infections. Performed at [facility], [place] [address], [place], [place] [address]      Labs: BNP (last 3 results) Recent Labs    10/14/18 0942  BNP 846.9*   Basic Metabolic Panel: Recent Labs  Lab 10/14/18 0942 10/16/18 0044 10/16/18 0348  NA 139 136  137  K 4.8 3.7 3.8  CL 108 103 103  CO2 24 23 23   GLUCOSE 126*  119* 111*  BUN 27* 18 16  CREATININE 0.80 0.88 0.94  CALCIUM 8.5* 8.3* 8.3*  MG  --  1.9  --   PHOS  --  3.6  --    Liver Function Tests: Recent Labs  Lab 10/14/18 0942  AST 24  ALT 38  ALKPHOS 126  BILITOT 0.4  PROT 6.7  ALBUMIN 3.7   Recent Labs  Lab 10/14/18 0942  LIPASE 24   No results for input(s): AMMONIA in the last 168 hours. CBC: Recent Labs  Lab 10/14/18 0942 10/15/18 0254 10/16/18 0348 10/17/18 0330  WBC 15.4* 12.3* 9.9 8.4  NEUTROABS 13.8*  --   --   --   HGB 12.6* 11.6* 11.3* 11.4*  HCT 39.6 37.3* 35.1* 35.3*  MCV 94.5 91.9 91.2 90.5  PLT 254 252 262 275   Cardiac Enzymes: Recent Labs  Lab 10/14/18 2003 10/15/18 0254  TROPONINI 0.35* 0.22*   BNP: Invalid input(s): POCBNP CBG: Recent Labs  Lab 10/14/18 1633 10/15/18 0804  GLUCAP 97 113*   D-Dimer No results for input(s): DDIMER in the last 72 hours. Hgb A1c No results for input(s): HGBA1C in the last 72 hours. Lipid Profile No results for input(s): CHOL, HDL, LDLCALC, TRIG, CHOLHDL, LDLDIRECT in the last 72 hours. Thyroid function studies No results for input(s): TSH, T4TOTAL, T3FREE, THYROIDAB in the last 72 hours.  Invalid input(s): FREET3 Anemia work up No results for input(s): VITAMINB12, FOLATE, FERRITIN, TIBC, IRON, RETICCTPCT in the last 72 hours. Urinalysis    Component Value Date/Time   COLORURINE YELLOW 10/14/2018 1533   APPEARANCEUR CLEAR 10/14/2018 1533   LABSPEC >1.046 (H) 10/14/2018 1533   PHURINE 5.0 10/14/2018 1533   GLUCOSEU NEGATIVE 10/14/2018 1533   HGBUR NEGATIVE 10/14/2018 1533   BILIRUBINUR NEGATIVE 10/14/2018 1533   KETONESUR 5 (A) 10/14/2018 1533   PROTEINUR NEGATIVE 10/14/2018 1533   NITRITE NEGATIVE 10/14/2018 1533   LEUKOCYTESUR NEGATIVE 10/14/2018 1533   Sepsis Labs Invalid input(s): PROCALCITONIN,  WBC,  LACTICIDVEN Microbiology Recent Results (from the past 240 hour(s))  MRSA PCR Screening     Status: None   Collection Time: 10/14/18  4:27  PM  Result Value Ref Range Status   MRSA by PCR NEGATIVE NEGATIVE Final    Comment:        The GeneXpert MRSA Assay (FDA approved for NASAL specimens only), is one component of a comprehensive MRSA colonization surveillance program. It is not intended to diagnose MRSA infection nor to guide or monitor treatment for MRSA infections. Performed at [facility], [place] [address] [place], [place] [address]    Time spent: 30 min  SIGNED:   Marylu Lund, MD  [facility] 10/18/2018, 9:38 AM  If 7PM-7AM, please contact night-coverage

## 2018-10-19 ENCOUNTER — Telehealth: Payer: Self-pay

## 2018-10-19 MED FILL — Ondansetron HCl Inj 4 MG/2ML (2 MG/ML): INTRAMUSCULAR | Qty: 2 | Status: AC

## 2018-10-19 MED FILL — ELIQUIS 5 MG TABLET: 5 | 13 days supply | Qty: 32 | Fill #0

## 2018-10-19 MED FILL — PANTOPRAZOLE SOD DR 40 MG T: 40 | 30 days supply | Qty: 30 | Fill #0

## 2018-10-19 NOTE — Telephone Encounter (Signed)
Patient returned phone call; pt scheduled for 01/21/19 at 09:00am

## 2018-10-19 NOTE — Telephone Encounter (Signed)
LMTCB. Please schedule patient a consult visit in March 2020 with BI.

## 2018-10-21 DIAGNOSIS — I2699 Other pulmonary embolism without acute cor pulmonale: Secondary | ICD-10-CM | POA: Diagnosis not present

## 2018-10-21 DIAGNOSIS — F329 Major depressive disorder, single episode, unspecified: Secondary | ICD-10-CM | POA: Diagnosis not present

## 2018-10-21 DIAGNOSIS — E782 Mixed hyperlipidemia: Secondary | ICD-10-CM | POA: Diagnosis not present

## 2018-10-21 MED FILL — FLUoxetine HCL 20 MG TABS: 20 | 90 days supply | Qty: 90 | Fill #0

## 2018-10-21 MED FILL — ELIQUIS 5 MG TABLET: 5 | 90 days supply | Qty: 180 | Fill #0

## 2018-10-21 MED FILL — SILDENAFIL CITRATE 50 MG TA: 50 | 30 days supply | Qty: 6 | Fill #0

## 2018-10-21 MED FILL — ATORVASTATIN 80 MG TABLET: 80 | 90 days supply | Qty: 90 | Fill #0

## 2018-10-22 ENCOUNTER — Other Ambulatory Visit: Payer: Self-pay | Admitting: *Deleted

## 2018-10-22 NOTE — Patient Outreach (Signed)
Steven Roy Steven Roy) Care Management  10/22/2018  Steven Roy 1956-09-08 [id number]    Subjective: Telephone call to patient's home  / mobile number, no answer, left HIPAA compliant voicemail message, and requested call back.    Objective: Per KPN (Knowledge Performance Now, point of care tool) and chart review, patient hospitalized 10/14/18 -10/18/18 for Pulmonary embolism,  PE (pulmonary thromboembolism.    Patient also has a history of mixed hyperlipidemia, iron deficiency anemia, prostate cancer, and status post Robotic-assisted laparoscopic radical prostatectomy with bilateral pelvic lymphadenectomy pm 09/30/18.       Assessment: Received [facility]MR Transition of care referral on 10/15/18.  Transition of care follow up pending patient contact.       Plan: RNCM will send unsuccessful outreach  letter, Steven Roy pamphlet, will call patient for 2nd telephone outreach attempt, transition of care follow up, and proceed with case closure, within 10 business days if no return call.       Steven Roy H. Annia Friendly, BSN, [place] [facility] Telephonic CM Phone: [phone] Fax: [phone]

## 2018-10-23 ENCOUNTER — Other Ambulatory Visit: Payer: Self-pay | Admitting: *Deleted

## 2018-10-23 NOTE — Patient Outreach (Addendum)
[facility]) Care Management  10/23/2018  Steven Roy July 25, 1956 [id number]   Subjective: Telephone call to patient's home  / mobile number, no answer, left HIPAA compliant voicemail message, and requested call back.    Objective: Per KPN (Knowledge Performance Now, point of care tool) and chart review, patient hospitalized 10/14/18 -10/18/18 for Pulmonary embolism,  PE (pulmonary thromboembolism.    Patient also has a history of mixed hyperlipidemia, iron deficiency anemia, prostate cancer, and status post Robotic-assisted laparoscopic radical prostatectomy with bilateral pelvic lymphadenectomy pm 09/30/18.       Assessment: Received UMR Transition of care referral on 10/15/18.  Transition of care follow up pending patient contact.       Plan: RNCM has sent unsuccessful outreach  letter, [facility] pamphlet, will call patient for 3rd telephone outreach attempt, transition of care follow up, and proceed with case closure, within 10 business days if no return call. Case transitioned to [facility] for Transition of care follow up.      Tywanna Seifer H. Annia Friendly, BSN, [place] [facility] Telephonic CM Phone: [phone] Fax: [phone]

## 2018-10-28 ENCOUNTER — Ambulatory Visit: Payer: Self-pay | Admitting: *Deleted

## 2018-10-30 DIAGNOSIS — M6281 Muscle weakness (generalized): Secondary | ICD-10-CM | POA: Diagnosis not present

## 2018-10-30 DIAGNOSIS — N393 Stress incontinence (female) (male): Secondary | ICD-10-CM | POA: Diagnosis not present

## 2018-10-30 DIAGNOSIS — M62838 Other muscle spasm: Secondary | ICD-10-CM | POA: Diagnosis not present

## 2018-11-02 DIAGNOSIS — Z760 Encounter for issue of repeat prescription: Secondary | ICD-10-CM | POA: Diagnosis not present

## 2018-11-05 ENCOUNTER — Other Ambulatory Visit: Payer: Self-pay | Admitting: *Deleted

## 2018-11-05 NOTE — Patient Outreach (Addendum)
[facility]) Care Management  11/05/2018  SHIVAN HODES [birth year] [id number]   Subjective:Telephone call to patient's home / mobile number, no answer, left HIPAA compliant voicemail message, and requested call back.    Objective:Per KPN (Knowledge Performance Now, point of care tool) and chart review,patient hospitalized 10/14/18 -10/18/18 forPulmonary embolism, PE (pulmonary thromboembolism. Patient also has a history of mixed hyperlipidemia, iron deficiency anemia, prostate cancer, and status post Robotic-assisted laparoscopic radical prostatectomy with bilateral pelvic lymphadenectomypm 09/30/18.     Assessment: Received [facility]MR Transition of care referral on 10/15/18.Transition of care follow up not completed due to unable to contact patient and will proceed with case closure.      Plan:Case closure due to unable to reach.     Genny Caulder H. Annia Friendly, BSN, [place] [facility] Telephonic CM Phone: [phone] Fax: [phone]

## 2018-12-02 DIAGNOSIS — D509 Iron deficiency anemia, unspecified: Secondary | ICD-10-CM | POA: Diagnosis not present

## 2018-12-09 DIAGNOSIS — H40013 Open angle with borderline findings, low risk, bilateral: Secondary | ICD-10-CM | POA: Diagnosis not present

## 2018-12-09 DIAGNOSIS — H53032 Strabismic amblyopia, left eye: Secondary | ICD-10-CM | POA: Diagnosis not present

## 2018-12-09 DIAGNOSIS — H5452A1 Low vision left eye category 1, normal vision right eye: Secondary | ICD-10-CM | POA: Diagnosis not present

## 2018-12-24 ENCOUNTER — Telehealth: Payer: Self-pay | Admitting: Hematology & Oncology

## 2018-12-24 NOTE — Telephone Encounter (Signed)
Received call from patient to confirm new patient appt 3/13 at 130 pm

## 2019-01-04 DIAGNOSIS — C61 Malignant neoplasm of prostate: Secondary | ICD-10-CM | POA: Diagnosis not present

## 2019-01-11 DIAGNOSIS — N393 Stress incontinence (female) (male): Secondary | ICD-10-CM | POA: Diagnosis not present

## 2019-01-11 DIAGNOSIS — N5231 Erectile dysfunction following radical prostatectomy: Secondary | ICD-10-CM | POA: Diagnosis not present

## 2019-01-11 DIAGNOSIS — C61 Malignant neoplasm of prostate: Secondary | ICD-10-CM | POA: Diagnosis not present

## 2019-01-11 DIAGNOSIS — C775 Secondary and unspecified malignant neoplasm of intrapelvic lymph nodes: Secondary | ICD-10-CM | POA: Diagnosis not present

## 2019-01-21 ENCOUNTER — Institutional Professional Consult (permissible substitution): Payer: Self-pay | Admitting: Pulmonary Disease

## 2019-01-22 ENCOUNTER — Inpatient Hospital Stay: Payer: 59

## 2019-01-22 ENCOUNTER — Other Ambulatory Visit: Payer: Self-pay

## 2019-01-22 ENCOUNTER — Encounter: Payer: Self-pay | Admitting: Hematology & Oncology

## 2019-01-22 ENCOUNTER — Inpatient Hospital Stay: Payer: 59 | Attending: Hematology & Oncology | Admitting: Hematology & Oncology

## 2019-01-22 VITALS — BP 113/77 | HR 63 | Temp 98.4°F | Resp 16 | Ht 74.0 in | Wt 202.1 lb

## 2019-01-22 DIAGNOSIS — I2602 Saddle embolus of pulmonary artery with acute cor pulmonale: Secondary | ICD-10-CM

## 2019-01-22 DIAGNOSIS — C61 Malignant neoplasm of prostate: Secondary | ICD-10-CM | POA: Diagnosis not present

## 2019-01-22 DIAGNOSIS — I82411 Acute embolism and thrombosis of right femoral vein: Secondary | ICD-10-CM | POA: Insufficient documentation

## 2019-01-22 DIAGNOSIS — I2699 Other pulmonary embolism without acute cor pulmonale: Secondary | ICD-10-CM | POA: Insufficient documentation

## 2019-01-22 LAB — CMP (CANCER CENTER ONLY)
ALT: 18 U/L (ref 0–44)
AST: 21 U/L (ref 15–41)
Albumin: 4.5 g/dL (ref 3.5–5.0)
Alkaline Phosphatase: 94 U/L (ref 38–126)
Anion gap: 7 (ref 5–15)
BUN: 21 mg/dL (ref 8–23)
CO2: 28 mmol/L (ref 22–32)
Calcium: 9.1 mg/dL (ref 8.9–10.3)
Chloride: 104 mmol/L (ref 98–111)
Creatinine: 0.92 mg/dL (ref 0.61–1.24)
GFR, Est AFR Am: >60 mL/min (ref >60)
Glucose, Bld: 91 mg/dL (ref 70–99)
Potassium: 3.9 mmol/L (ref 3.5–5.1)
Sodium: 139 mmol/L (ref 135–145)
Total Bilirubin: 0.8 mg/dL (ref 0.3–1.2)
Total Protein: 6.9 g/dL (ref 6.5–8.1)

## 2019-01-22 LAB — CBC WITH DIFFERENTIAL (CANCER CENTER ONLY)
Abs Immature Granulocytes: 0.01 10*3/uL (ref 0.00–0.07)
Basophils Absolute: 0 10*3/uL (ref 0.0–0.1)
Basophils Relative: 1 %
EOS ABS: 0.2 10*3/uL (ref 0.0–0.5)
Eosinophils Relative: 3 %
HCT: 41.6 % (ref 39.0–52.0)
Hemoglobin: 14 g/dL (ref 13.0–17.0)
Immature Granulocytes: 0 %
Lymphocytes Relative: 23 %
Lymphs Abs: 1.5 10*3/uL (ref 0.7–4.0)
MCH: 30.2 pg (ref 26.0–34.0)
MCHC: 33.7 g/dL (ref 30.0–36.0)
MCV: 89.7 fL (ref 80.0–100.0)
Monocytes Absolute: 0.5 10*3/uL (ref 0.1–1.0)
Monocytes Relative: 7 %
Neutro Abs: 4.2 10*3/uL (ref 1.7–7.7)
Neutrophils Relative %: 66 %
Platelet Count: 226 10*3/uL (ref 150–400)
RBC: 4.64 MIL/uL (ref 4.22–5.81)
RDW: 14.9 % (ref 11.5–15.5)
WBC Count: 6.3 10*3/uL (ref 4.0–10.5)
nRBC: 0 % (ref 0.0–0.2)

## 2019-01-22 NOTE — Progress Notes (Signed)
Referral MD  Reason for Referral: Submassive pulmonary embolus with bilateral lower extremity DVTs  Chief Complaint  Patient presents with  . New Patient (Initial Visit)  : I had a blood clot in my lung and legs after prostate cancer surgery.  HPI: Mr. Staiger is a very nice 63 year old white male.  He is from [place] originally.  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  He has 4 children.  They are all healthy.  There is a history of lung cancer with his father.  There is no history of blood clots in the family.  He was found to have stage III prostate cancer back in December.  He underwent a robotic prostatectomy.  This was done without complications.  About 2 weeks later, he said he had some cramping in his legs.  This seemed to go away in a day.  Following this, he began to have some chest wall pain.  He had a cough.  There is no hemoptysis.  He had progressive shortness of breath.  He went to the bathroom and passed out.  Thankfully, 1 of his sons was living with him.  He was brought to the emergency room on 10/14/2018.Marland Kitchen  He underwent emergent CT angiogram of the chest.  This showed a bilateral pulmonary embolism.  He has significant clot burden.  He was also found to have blood clots in his leg.  The Dopplers of his legs show that he had acute thrombus in the right femoral vein down to the right peroneal vein.  Also, he had blood clots in the left femoral vein down to the left gastrocnemius vein.  He was admitted.  He was started on heparin.  He was then transitioned over to Eliquis.  No hypercoagulable studies were done in the hospital.  He is feeling better.  He is not having chest wall pain.  He is having better breathing.  There is no leg swelling.  There is no leg pain.  He was kindly referred to the [facility] so that we could decide as to how long he needed to be on blood thinner.  He does not smoke.  He never  smoked.  There is no history of diabetes.  There is no history of cholesterol issues.  Overall, his performance status is ECOG 0.     Past Medical History:  Diagnosis Date  . Anxiety    denies  . Depression    denies  . GERD (gastroesophageal reflux disease)   . PONV (postoperative nausea and vomiting)   . Prostate cancer [facility])   :  Past Surgical History:  Procedure Laterality Date  . ANKLE ARTHROSCOPY    . HAND SURGERY    . LYMPHADENECTOMY Bilateral 09/30/2018   Procedure: LYMPHADENECTOMY;  Surgeon: Alexis Frock, MD;  Location: [facility];  Service: Urology;  Laterality: Bilateral;  . PROSTATE BIOPSY    . ROBOT ASSISTED LAPAROSCOPIC RADICAL PROSTATECTOMY N/A 09/30/2018   Procedure: XI ROBOTIC ASSISTED LAPAROSCOPIC RADICAL PROSTATECTOMY;  Surgeon: Alexis Frock, MD;  Location: [facility];  Service: Urology;  Laterality: N/A;  3 HRS  . XI ROBOTIC ASSISTED SIMPLE PROSTATECTOMY     Dr. Tresa Moore 09-30-18  :   Current Outpatient Medications:  .  atorvastatin (LIPITOR) 80 MG tablet, Take 80 mg by mouth daily., Disp: , Rfl:  .  ferrous sulfate 325 (65 FE) MG EC tablet, Take 325 mg by mouth 2 (two) times a week., Disp: , Rfl:  .  Multiple Vitamins-Minerals (MENS MULTIVITAMIN PLUS) TABS, Take 1 tablet by mouth daily., Disp: , Rfl:  .  ranitidine (ZANTAC) 150 MG tablet, Take 150 mg by mouth 2 (two) times daily as needed for heartburn. , Disp: , Rfl:  .  apixaban (ELIQUIS) 5 MG TABS tablet, Take 1 tablet (5 mg total) by mouth 2 (two) times daily. Start after finishing 10mg  bid dosing, Disp: 60 tablet, Rfl: 0:  :  No Known Allergies:  Family History  Problem Relation Age of Onset  . Cancer Father        unknown  . Prostate cancer Brother        surgery followed by radiation  . Breast cancer Neg Hx   . Colon cancer Neg Hx   . Pancreatic cancer Neg Hx   :  Social History   Socioeconomic History  . Marital status: Divorced    Spouse name: Not on file  . Number of children: 4  .  Years of education: Not on file  . Highest education level: Not on file  Occupational History    Comment: Mudlogger of volunteers   Social Needs  . Financial resource strain: Not on file  . Food insecurity:    Worry: Not on file    Inability: Not on file  . Transportation needs:    Medical: Not on file    Non-medical: Not on file  Tobacco Use  . Smoking status: Never Smoker  . Smokeless tobacco: Never Used  Substance and Sexual Activity  . Alcohol use: Yes    Comment: occasional  . Drug use: Never  . Sexual activity: Yes  Lifestyle  . Physical activity:    Days per week: Not on file    Minutes per session: Not on file  . Stress: Not on file  Relationships  . Social connections:    Talks on phone: Not on file    Gets together: Not on file    Attends religious service: Not on file    Active member of club or organization: Not on file    Attends meetings of clubs or organizations: Not on file    Relationship status: Not on file  . Intimate partner violence:    Fear of current or ex partner: Not on file    Emotionally abused: Not on file    Physically abused: Not on file    Forced sexual activity: Not on file  Other Topics Concern  . Not on file  Social History Narrative   Resides in [place].  :  Review of Systems  Constitutional: Negative.   HENT: Negative.   Eyes: Negative.   Respiratory: Negative.   Cardiovascular: Negative.   Gastrointestinal: Negative.   Genitourinary: Negative.   Musculoskeletal: Negative.   Skin: Negative.   Neurological: Negative.   Endo/Heme/Allergies: Negative.   Psychiatric/Behavioral: Negative.      Exam: Well-developed and well-nourished white male in no obvious distress.  Vital signs show temperature of 98.4.  Pulse 63.  Blood pressure 113/77.  Weight is 202 pounds.  Head neck exam shows no ocular or oral lesions.  There are no palpable cervical or supraclavicular lymph nodes.  Lungs are clear bilaterally.  Cardiac exam regular  rate and rhythm with no murmurs, rubs or bruits.  Abdomen is soft.  He has good bowel sounds.  There is no fluid wave.  There is no palpable liver or spleen tip.  Back exam shows no tenderness over the spine, ribs or hips.  Extremities shows no  clubbing, cyanosis or edema.  He has good range of motion of his joints.  He has no palpable venous cord in his legs.  He has a negative Homans sign bilaterally.  He has good pulses in his distal extremities.  Neurological exam is nonfocal.  Skin exam shows no rashes, ecchymoses or petechia. @IPVITALS @   Recent Labs    01/22/19 1359  WBC 6.3  HGB 14.0  HCT 41.6  PLT 226   Recent Labs    01/22/19 1359  NA 139  K 3.9  CL 104  CO2 28  GLUCOSE 91  BUN 21  CREATININE 0.92  CALCIUM 9.1    Blood smear review: None  Pathology: None    Assessment and Plan: Mr. Bernards is a very nice 63 year old white male.  He has stage III prostate cancer.  2 weeks afterwards, he had bilateral lower extremity DVTs followed by the pulmonary emboli.  I would not think that he has a thrombophilic condition.  However, we will need to check him for this.  I would do this because he has children and if he does have a thrombophilic state, we will need to make sure that his children are checked.  Is been 3 months since he had the PE/DVT.  He will need to have a follow-up CT angiogram and lower extremity Dopplers to see how the clot burden has improved.  I would think that his blood clots in the lung are gone.  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  He will definitely need 1 full year of anticoagulation.  I would then consider him for 1 year additional maintenance therapy.  It would not surprise me if he came back positive for a lupus anticoagulant.  If he does, we will have to check this in the future.  I spent about 45 minutes with him.  I counseled him.  I had to set up his of future appointments and his scans and Doppler test.  I answered all of his questions.   I reassured him that I did not think that he was going to have problems in the future with blood clots.  I suppose that the prostate cancer itself could have initiated these thromboembolic events.  I would think though that he would have had metastatic disease and not localized or locally advanced disease.  It really nice to talk to him.  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  I will see him back in about 6 weeks.

## 2019-01-23 LAB — CARDIOLIPIN ANTIBODIES, IGG, IGM, IGA
Anticardiolipin IgA: <9 APL U/mL (ref 0–11)
Anticardiolipin IgG: <9 GPL U/mL (ref 0–14)
Anticardiolipin IgM: <9 MPL U/mL (ref 0–12)

## 2019-01-23 LAB — PSA, TOTAL AND FREE
PSA, Free Pct: <20 %
PSA, Free: <0.02 ng/mL
Prostate Specific Ag, Serum: 0.1 ng/mL (ref 0.0–4.0)

## 2019-01-23 LAB — HOMOCYSTEINE: Homocysteine: 10.6 umol/L (ref 0.0–17.2)

## 2019-01-23 LAB — PROTEIN C, TOTAL: Protein C, Total: 121 % (ref 60–150)

## 2019-01-23 LAB — ANTITHROMBIN III: AntiThromb III Func: 109 % (ref 75–120)

## 2019-01-25 ENCOUNTER — Other Ambulatory Visit: Payer: Self-pay | Admitting: Family

## 2019-01-25 ENCOUNTER — Telehealth: Payer: Self-pay | Admitting: Hematology & Oncology

## 2019-01-25 DIAGNOSIS — I2602 Saddle embolus of pulmonary artery with acute cor pulmonale: Secondary | ICD-10-CM

## 2019-01-25 DIAGNOSIS — I824Y3 Acute embolism and thrombosis of unspecified deep veins of proximal lower extremity, bilateral: Secondary | ICD-10-CM

## 2019-01-25 LAB — LUPUS ANTICOAGULANT PANEL
DRVVT: 79.1 s — ABNORMAL HIGH (ref 0.0–47.0)
PTT Lupus Anticoagulant: 45.1 s (ref 0.0–51.9)

## 2019-01-25 LAB — DRVVT MIX: DRVVT MIX: 51.9 s — AB (ref 0.0–47.0)

## 2019-01-25 LAB — PROTEIN S ACTIVITY: Protein S Activity: 99 % (ref 63–140)

## 2019-01-25 LAB — DRVVT CONFIRM: dRVVT Confirm: 1.3 ratio — ABNORMAL HIGH (ref 0.8–1.2)

## 2019-01-25 LAB — PROTEIN S, TOTAL: Protein S Ag, Total: 98 % (ref 60–150)

## 2019-01-25 LAB — PROTEIN C ACTIVITY: PROTEIN C ACTIVITY: 133 % (ref 73–180)

## 2019-01-25 MED FILL — SILDENAFIL CITRATE 50 MG TA: 50 | 30 days supply | Qty: 6 | Fill #1

## 2019-01-25 NOTE — Telephone Encounter (Signed)
lmom to inform pt of Ct and doppler scan on 4/3 at 0845 at [facility] and lab/ov 03/05/19 at 11 am per 3/13 LOS

## 2019-01-26 LAB — BETA-2-GLYCOPROTEIN I ABS, IGG/M/A
Beta-2 Glyco I IgG: <9 GPI IgG units (ref 0–20)
Beta-2-Glycoprotein I IgM: <9 GPI IgM units (ref 0–32)

## 2019-01-26 MED FILL — ATORVASTATIN 80 MG TABLET: 80 | 90 days supply | Qty: 90 | Fill #1

## 2019-01-27 ENCOUNTER — Other Ambulatory Visit: Payer: Self-pay | Admitting: *Deleted

## 2019-01-27 MED ORDER — APIXABAN 5 MG PO TABS: 5.0000 mg | ORAL_TABLET | Freq: Two times a day (BID) | ORAL | 2 refills | Status: DC

## 2019-01-27 MED FILL — ELIQUIS 5 MG TABLET: 5 | 30 days supply | Qty: 60 | Fill #0

## 2019-01-29 LAB — FACTOR 5 LEIDEN

## 2019-01-29 LAB — PROTHROMBIN GENE MUTATION

## 2019-02-03 ENCOUNTER — Telehealth: Payer: Self-pay | Admitting: *Deleted

## 2019-02-03 NOTE — Telephone Encounter (Signed)
-----   Message from [name], MD sent at 02/03/2019  3:40 PM EDT ----- I left a message on his answering machine.  I told him that all of our coagulation studies were normal except for the lupus anticoagulant.  This might be a true positive.  The only way we will know is to repeat this in about 3 months.  For right now, we will continue him on his blood thinner.  There is no need to make any changes with his blood thinner.  He can always call me back if he has any questions.  Laurey Arrow

## 2019-02-12 ENCOUNTER — Ambulatory Visit (HOSPITAL_COMMUNITY): Admission: RE | Admit: 2019-02-12 | Payer: 59 | Source: Ambulatory Visit

## 2019-02-12 ENCOUNTER — Ambulatory Visit (HOSPITAL_COMMUNITY): Payer: 59

## 2019-03-02 ENCOUNTER — Telehealth: Payer: 59 | Admitting: Nurse Practitioner

## 2019-03-02 DIAGNOSIS — W57XXXA Bitten or stung by nonvenomous insect and other nonvenomous arthropods, initial encounter: Secondary | ICD-10-CM

## 2019-03-02 DIAGNOSIS — S80862D Insect bite (nonvenomous), left lower leg, subsequent encounter: Secondary | ICD-10-CM

## 2019-03-02 MED ORDER — DOXYCYCLINE HYCLATE 100 MG PO TABS: 100.0000 mg | ORAL_TABLET | Freq: Two times a day (BID) | ORAL | 0 refills | Status: DC

## 2019-03-02 MED FILL — SILDENAFIL CITRATE 50 MG TA: 50 | 30 days supply | Qty: 6 | Fill #2

## 2019-03-02 MED FILL — FLUOXETINE HCL 10 MG TABS: 10 | 30 days supply | Qty: 30 | Fill #1

## 2019-03-02 MED FILL — ELIQUIS 5 MG TABLET: 5 | 30 days supply | Qty: 60 | Fill #1

## 2019-03-02 NOTE — Progress Notes (Signed)
Thank you for describing your tick bite, Here is how we plan to help! Based on the information that you shared with me it looks like you have A tick that bite that we will treat with a short course of doxycycline.  In most cases a tick bite is painless and does not itch.  Most tick bites in which the tick is quickly removed do not require prescriptions. Ticks can transmit several diseases if they are infected and remain attacked to your skin. Therefore the length that the tick was attached and any symptoms you have experienced after the bite are import to accurately develop your custom treatment plan. In most cases a single dose of doxycycline may prevent the development of a more serious condition.  Based on your information I have Provided a home care guide for tick bites  and  instructions on when to call for help. and Your symptoms indicate that you need a longer course of antibiotics . I have sent doxycycline 100 mg twice a day for 21 days to the pharmacy that you selected.   Which ticks  are associated with illness?  The Wood Tick (dog tick) is the size of a watermelon seed and can sometimes transmit [facility] spotted fever and [place] tick fever.   The Deer Tick (black-legged tick) is between the size of a poppy seed (pin head) and an apple seed, and can sometimes transmit Lyme disease.  A brown to black tick with a white splotch on its back is likely a male Amblyomma americanum ([place] tick). This tick has been associated with Southern Tick Associated illness ( STARI)  Lyme disease has become the most common tick-borne illness in the [place]. The risk of Lyme disease following a recognized deer tick bite is estimated to be 1%.  The majority of cases of Lyme disease start with a bull's eye rash at the site of the tick bite. The rash can occur days to weeks (typically 7-10 days) after a tick bite. Treatment with antibiotics is indicated if this rash appears. Flu-like  symptoms may accompany the rash, including: fever, chills, headaches, muscle aches, and fatigue. Removing ticks promptly may prevent tick borne disease.  What can be used to prevent Tick Bites?   Insect repellant with at leas 20% DEET.  Wearing long pants with sock and shoes.  Avoiding tall grass and heavily wooded areas.  Checking your skin after being outdoors.  Shower with a washcloth after outdoor exposures.  HOME CARE ADVICE FOR TICK BITE  1. Wood Tick Removal:  o Use a pair of tweezers and grasp the wood tick close to the skin (on its head). Pull the wood tick straight upward without twisting or crushing it. Maintain a steady pressure until it releases its grip.   o If tweezers aren't available, use fingers, a loop of thread around the jaws, or a needle between the jaws for traction.  o Note: covering the tick with petroleum jelly, nail polish or rubbing alcohol doesn't work. Neither does touching the tick with a hot or cold object. 2. Tiny Deer Tick Removal:   o Needs to be scraped off with a knife blade or credit card edge. o Place tick in a sealed container (e.g. glass jar, zip lock plastic bag), in case your doctor wants to see it. 3. Tick's Head Removal:  o If the wood tick's head breaks off in the skin, it must be removed. Clean the skin. Then use a sterile needle to uncover  the head and lift it out or scrape it off.  o If a very small piece of the head remains, the skin will eventually slough it off. 4. Antibiotic Ointment:  o Wash the wound and your hands with soap and water after removal to prevent catching any tick disease.  Apply an over the counter antibiotic ointment (e.g. bacitracin) to the bite once. 5. Expected Course: Tick bites normally don't itch or hurt. That's why they often go unnoticed. 6. Call Your Doctor If:  o You can't remove the tick or the tick's head o Fever, a severe head ache, or rash occur in the next 2 weeks o Bite begins to look  infected o Lyme's disease is common in your area o You have not had a tetanus in the last 10 years o Your current symptoms become worse    MAKE SURE YOU   Understand these instructions.  Will watch your condition.  Will get help right away if you are not doing well or get worse.   Thank you for choosing an e-visit.  Your e-visit answers were reviewed by a board certified advanced clinical practitioner to complete your personal care plan. Depending upon the condition, your plan could have included both over the counter or prescription medications. Please review your pharmacy choice. If there is a problem you may use MyChart messaging to have the prescription routed to another pharmacy. Your safety is important to [place]. If you have drug allergies check your prescription carefully.   You can use MyChart to ask questions about today's visit, request a non-urgent call back, or ask for a work or school excuse for 24 hours related to this e-Visit. If it has been greater than 24 hours you will need to follow up with your provider, or enter a new e-Visit to address those concerns.  You will get an email in the next two days asking about your experience. I hope  that your e-visit has been valuable and will speed your recovery

## 2019-03-03 ENCOUNTER — Other Ambulatory Visit: Payer: Self-pay

## 2019-03-03 ENCOUNTER — Ambulatory Visit (HOSPITAL_COMMUNITY): Admission: RE | Admit: 2019-03-03 | Payer: 59 | Source: Ambulatory Visit

## 2019-03-03 ENCOUNTER — Ambulatory Visit (HOSPITAL_COMMUNITY)
Admission: RE | Admit: 2019-03-03 | Discharge: 2019-03-03 | Disposition: A | Discharge status: Home / Self Care | Payer: 59 | Source: Ambulatory Visit | Attending: Family | Admitting: Family

## 2019-03-03 DIAGNOSIS — I2602 Saddle embolus of pulmonary artery with acute cor pulmonale: Secondary | ICD-10-CM

## 2019-03-03 DIAGNOSIS — I824Y3 Acute embolism and thrombosis of unspecified deep veins of proximal lower extremity, bilateral: Secondary | ICD-10-CM

## 2019-03-03 MED FILL — DOXYCYCLINE HYCLATE 100 MG: 100 | 21 days supply | Qty: 42 | Fill #0

## 2019-03-03 NOTE — Progress Notes (Signed)
Lower extremity venous has been completed.   Preliminary results in CV Proc.   Steven Roy 03/03/2019 9:19 AM

## 2019-03-04 ENCOUNTER — Telehealth: Payer: Self-pay | Admitting: *Deleted

## 2019-03-04 NOTE — Telephone Encounter (Addendum)
Message left on personal voice mail  ----- Message from [name], MD sent at 03/03/2019  2:51 PM EDT ----- Call - NO blood clot in the legs!!  Laurey Arrow

## 2019-03-05 ENCOUNTER — Telehealth: Payer: Self-pay | Admitting: Hematology & Oncology

## 2019-03-05 ENCOUNTER — Inpatient Hospital Stay: Payer: 59

## 2019-03-05 ENCOUNTER — Inpatient Hospital Stay: Payer: 59 | Attending: Hematology & Oncology | Admitting: Hematology & Oncology

## 2019-03-05 ENCOUNTER — Other Ambulatory Visit: Payer: Self-pay

## 2019-03-05 VITALS — BP 119/71 | HR 68 | Temp 98.4°F | Resp 16 | Wt 204.0 lb

## 2019-03-05 DIAGNOSIS — Z7901 Long term (current) use of anticoagulants: Secondary | ICD-10-CM

## 2019-03-05 DIAGNOSIS — I2699 Other pulmonary embolism without acute cor pulmonale: Secondary | ICD-10-CM | POA: Diagnosis not present

## 2019-03-05 DIAGNOSIS — C61 Malignant neoplasm of prostate: Secondary | ICD-10-CM

## 2019-03-05 DIAGNOSIS — I2602 Saddle embolus of pulmonary artery with acute cor pulmonale: Secondary | ICD-10-CM

## 2019-03-05 DIAGNOSIS — D6862 Lupus anticoagulant syndrome: Secondary | ICD-10-CM | POA: Diagnosis not present

## 2019-03-05 LAB — CBC WITH DIFFERENTIAL (CANCER CENTER ONLY)
Abs Immature Granulocytes: 0.01 10*3/uL (ref 0.00–0.07)
Basophils Absolute: 0.1 10*3/uL (ref 0.0–0.1)
Basophils Relative: 1 %
Eosinophils Absolute: 0.1 10*3/uL (ref 0.0–0.5)
Eosinophils Relative: 2 %
HCT: 40.2 % (ref 39.0–52.0)
Hemoglobin: 14 g/dL (ref 13.0–17.0)
Immature Granulocytes: 0 %
Lymphocytes Relative: 22 %
Lymphs Abs: 1.4 10*3/uL (ref 0.7–4.0)
MCH: 31.2 pg (ref 26.0–34.0)
MCHC: 34.8 g/dL (ref 30.0–36.0)
MCV: 89.5 fL (ref 80.0–100.0)
Monocytes Absolute: 0.4 10*3/uL (ref 0.1–1.0)
Monocytes Relative: 6 %
Neutro Abs: 4.5 10*3/uL (ref 1.7–7.7)
Neutrophils Relative %: 69 %
Platelet Count: 212 10*3/uL (ref 150–400)
RBC: 4.49 MIL/uL (ref 4.22–5.81)
RDW: 13.4 % (ref 11.5–15.5)
WBC Count: 6.4 10*3/uL (ref 4.0–10.5)
nRBC: 0 % (ref 0.0–0.2)

## 2019-03-05 LAB — CMP (CANCER CENTER ONLY)
ALT: 19 U/L (ref 0–44)
AST: 25 U/L (ref 15–41)
Albumin: 4.5 g/dL (ref 3.5–5.0)
Alkaline Phosphatase: 109 U/L (ref 38–126)
Anion gap: 10 (ref 5–15)
BUN: 21 mg/dL (ref 8–23)
CO2: 25 mmol/L (ref 22–32)
Calcium: 9.4 mg/dL (ref 8.9–10.3)
Chloride: 103 mmol/L (ref 98–111)
Creatinine: 0.84 mg/dL (ref 0.61–1.24)
GFR, Est AFR Am: >60 mL/min (ref >60)
GFR, Estimated: >60 mL/min (ref >60)
Glucose, Bld: 98 mg/dL (ref 70–99)
Potassium: 3.8 mmol/L (ref 3.5–5.1)
Sodium: 138 mmol/L (ref 135–145)
Total Bilirubin: 0.9 mg/dL (ref 0.3–1.2)
Total Protein: 6.8 g/dL (ref 6.5–8.1)

## 2019-03-05 LAB — D-DIMER, QUANTITATIVE: D-Dimer, Quant: <0.27 ug/mL-FEU (ref 0.00–0.50)

## 2019-03-05 NOTE — Telephone Encounter (Signed)
lmom to inform pt of 6/26 appt at 12 pm per 4/24 los

## 2019-03-05 NOTE — Progress Notes (Signed)
Hematology and Oncology Follow Up Visit  Steven Roy [id number] 09/27/1956 63 y.o. 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 twice daily -to complete 1 year of therapeutic anticoagulation in December 2020     Interim History:  Steven Roy is back for follow-up.  This is a second office visit.  We first saw him on 01/22/2019.  At that time, we did a comprehensive thrombophilic panel on him.  All that was found was a lupus anticoagulant.  I am not sure if this is truly significant.  When we see him back in a couple months, we will have to repeat the lupus anticoagulant panel.  We did do Dopplers of his legs.  These were done a week ago.  The Dopplers did not show any residual thromboembolic disease in his legs.  He was supposed to have a CT angiogram done.  This cannot be done because of the coronavirus restrictions.  He is feeling well.  He has had no problems with Eliquis.  He has had no bleeding.  He has had no change in bowel or bladder habits.  He has had no cough.  There is no chest wall pain.  His appetite is doing well.  He did have a tick bite on his left leg.  This was probably from his dog.  He was placed on antibiotics for 21 days as he developed a rash after the tick bite.  He has had no headache.  Overall, his performance status is ECOG 0.  Medications:  Current Outpatient Medications:  .  sildenafil (VIAGRA) 50 MG tablet, 50 mg once daily as needed 1 hour before sexual activity; may be taken up to 4 hours before sexual activity. Reduce to 25 mg once daily if side effects occur. May increase to a maximum dose of 100 mg once daily if there is incomplete response., Disp: , Rfl:  .  apixaban (ELIQUIS) 5 MG TABS tablet, Take 1 tablet (5 mg total) by mouth 2 (two) times daily for 30 days. Start after finishing 10mg  bid dosing,  Disp: 60 tablet, Rfl: 2 .  atorvastatin (LIPITOR) 80 MG tablet, Take 80 mg by mouth daily., Disp: , Rfl:  .  doxycycline (VIBRA-TABS) 100 MG tablet, Take 1 tablet (100 mg total) by mouth 2 (two) times daily. 1 po bid, Disp: 42 tablet, Rfl: 0 .  ferrous sulfate 325 (65 FE) MG EC tablet, Take 325 mg by mouth 2 (two) times a week., Disp: , Rfl:  .  Multiple Vitamins-Minerals (MENS MULTIVITAMIN PLUS) TABS, Take 1 tablet by mouth daily., Disp: , Rfl:  .  ranitidine (ZANTAC) 150 MG tablet, Take 150 mg by mouth 2 (two) times daily as needed for heartburn. , Disp: , Rfl:   Allergies: No Known Allergies  Past Medical History, Surgical history, Social history, and Family History were reviewed and updated.  Review of Systems: Review of Systems  Constitutional: Negative.   HENT:  Negative.   Eyes: Negative.   Respiratory: Negative.   Cardiovascular: Negative.   Gastrointestinal: Negative.   Endocrine: Negative.   Genitourinary: Negative.    Musculoskeletal: Negative.   Skin: Negative.   Neurological: Negative.   Hematological: Negative.   Psychiatric/Behavioral: Negative.     Physical Exam:  weight is 204 lb (92.5 kg). His oral temperature is 98.4 F (36.9 C). His blood pressure is 119/71 and his pulse is  68. His respiration is 16 and oxygen saturation is 100%.   Wt Readings from Last 3 Encounters:  03/05/19 204 lb (92.5 kg)  01/22/19 202 lb 1.9 oz (91.7 kg)  10/18/18 200 lb (90.7 kg)    Physical Exam Vitals signs reviewed.  HENT:     Head: Normocephalic and atraumatic.  Eyes:     Pupils: Pupils are equal, round, and reactive to light.  Neck:     Musculoskeletal: Normal range of motion.  Cardiovascular:     Rate and Rhythm: Normal rate and regular rhythm.     Heart sounds: Normal heart sounds.  Pulmonary:     Effort: Pulmonary effort is normal.     Breath sounds: Normal breath sounds.  Abdominal:     General: Bowel sounds are normal.     Palpations: Abdomen is soft.   Musculoskeletal: Normal range of motion.        General: No tenderness or deformity.  Lymphadenopathy:     Cervical: No cervical adenopathy.  Skin:    General: Skin is warm and dry.     Findings: No erythema or rash.  Neurological:     Mental Status: He is alert and oriented to person, place, and time.  Psychiatric:        Behavior: Behavior normal.        Thought Content: Thought content normal.        Judgment: Judgment normal.      Lab Results  Component Value Date   WBC 6.4 03/05/2019   HGB 14.0 03/05/2019   HCT 40.2 03/05/2019   MCV 89.5 03/05/2019   PLT 212 03/05/2019     Chemistry      Component Value Date/Time   NA 138 03/05/2019 1107   K 3.8 03/05/2019 1107   CL 103 03/05/2019 1107   CO2 25 03/05/2019 1107   BUN 21 03/05/2019 1107   CREATININE 0.84 03/05/2019 1107      Component Value Date/Time   CALCIUM 9.4 03/05/2019 1107   ALKPHOS 109 03/05/2019 1107   AST 25 03/05/2019 1107   ALT 19 03/05/2019 1107   BILITOT 0.9 03/05/2019 1107       Impression and Plan: Steven Roy is a 63 year old white male.  He had extensive thromboembolic disease after having prostate cancer surgery.  He was found to have a lupus anticoagulant when we saw him.  He will stay on Eliquis right now.  Again he will be on Eliquis for 1 year as therapeutic treatment and then 1 year as maintenance therapy.  I will have to repeat a CT angiogram on him.  Again, I am not sure why he did not have one done already outside of the coronavirus restrictions.  I will plan to see him back in 2 months.  I think this would be reasonable.   [name], MD 4/24/202012:21 PM

## 2019-04-14 MED FILL — ELIQUIS 5 MG TABLET: 5 | 30 days supply | Qty: 60 | Fill #2

## 2019-04-16 ENCOUNTER — Ambulatory Visit (HOSPITAL_COMMUNITY)
Admission: RE | Admit: 2019-04-16 | Discharge: 2019-04-16 | Disposition: A | Discharge status: Home / Self Care | Payer: 59 | Source: Ambulatory Visit | Attending: Hematology & Oncology | Admitting: Hematology & Oncology

## 2019-04-16 ENCOUNTER — Other Ambulatory Visit: Payer: Self-pay

## 2019-04-16 DIAGNOSIS — I2699 Other pulmonary embolism without acute cor pulmonale: Secondary | ICD-10-CM | POA: Insufficient documentation

## 2019-04-16 DIAGNOSIS — Z86711 Personal history of pulmonary embolism: Secondary | ICD-10-CM | POA: Diagnosis not present

## 2019-04-16 DIAGNOSIS — R918 Other nonspecific abnormal finding of lung field: Secondary | ICD-10-CM | POA: Diagnosis not present

## 2019-04-16 MED ORDER — SODIUM CHLORIDE (PF) 0.9 % IJ SOLN
INTRAMUSCULAR | Status: AC
  Filled 2019-04-16: qty 50

## 2019-04-16 MED ORDER — IOHEXOL 350 MG/ML SOLN
100.0000 mL | Freq: Once | INTRAVENOUS | Status: AC | PRN
  Administered 2019-04-16: 100 mL via INTRAVENOUS

## 2019-04-19 ENCOUNTER — Telehealth: Payer: Self-pay | Admitting: *Deleted

## 2019-04-19 DIAGNOSIS — C61 Malignant neoplasm of prostate: Secondary | ICD-10-CM | POA: Diagnosis not present

## 2019-04-19 NOTE — Telephone Encounter (Signed)
-----   Message from [name], MD sent at 04/16/2019  4:08 PM EDT ----- Call - the blood clot in the lung is gone!!!  Steven Roy

## 2019-04-19 NOTE — Telephone Encounter (Signed)
Unable to reach pt, lmovm with results and request to call back with any concerns.

## 2019-04-26 DIAGNOSIS — C61 Malignant neoplasm of prostate: Secondary | ICD-10-CM | POA: Diagnosis not present

## 2019-04-26 DIAGNOSIS — C775 Secondary and unspecified malignant neoplasm of intrapelvic lymph nodes: Secondary | ICD-10-CM | POA: Diagnosis not present

## 2019-04-26 DIAGNOSIS — N393 Stress incontinence (female) (male): Secondary | ICD-10-CM | POA: Diagnosis not present

## 2019-04-26 DIAGNOSIS — N5231 Erectile dysfunction following radical prostatectomy: Secondary | ICD-10-CM | POA: Diagnosis not present

## 2019-05-06 DIAGNOSIS — N5231 Erectile dysfunction following radical prostatectomy: Secondary | ICD-10-CM | POA: Diagnosis not present

## 2019-05-07 ENCOUNTER — Inpatient Hospital Stay: Payer: 59

## 2019-05-07 ENCOUNTER — Inpatient Hospital Stay: Payer: 59 | Attending: Hematology & Oncology | Admitting: Hematology & Oncology

## 2019-05-07 ENCOUNTER — Encounter: Payer: Self-pay | Admitting: Hematology & Oncology

## 2019-05-07 ENCOUNTER — Other Ambulatory Visit: Payer: Self-pay

## 2019-05-07 VITALS — BP 109/68 | HR 75 | Temp 98.3°F | Resp 20 | Wt 203.0 lb

## 2019-05-07 DIAGNOSIS — I2601 Septic pulmonary embolism with acute cor pulmonale: Secondary | ICD-10-CM

## 2019-05-07 DIAGNOSIS — Z7901 Long term (current) use of anticoagulants: Secondary | ICD-10-CM | POA: Insufficient documentation

## 2019-05-07 DIAGNOSIS — Z8546 Personal history of malignant neoplasm of prostate: Secondary | ICD-10-CM | POA: Insufficient documentation

## 2019-05-07 DIAGNOSIS — Z86718 Personal history of other venous thrombosis and embolism: Secondary | ICD-10-CM | POA: Insufficient documentation

## 2019-05-07 DIAGNOSIS — I2699 Other pulmonary embolism without acute cor pulmonale: Secondary | ICD-10-CM

## 2019-05-07 DIAGNOSIS — D6862 Lupus anticoagulant syndrome: Secondary | ICD-10-CM | POA: Diagnosis not present

## 2019-05-07 LAB — CBC WITH DIFFERENTIAL (CANCER CENTER ONLY)
Abs Immature Granulocytes: 0.02 10*3/uL (ref 0.00–0.07)
Basophils Absolute: 0.1 10*3/uL (ref 0.0–0.1)
Basophils Relative: 1 %
Eosinophils Absolute: 0.2 10*3/uL (ref 0.0–0.5)
Eosinophils Relative: 3 %
HCT: 38.9 % — ABNORMAL LOW (ref 39.0–52.0)
Hemoglobin: 13.4 g/dL (ref 13.0–17.0)
Immature Granulocytes: 0 %
Lymphocytes Relative: 24 %
Lymphs Abs: 1.6 10*3/uL (ref 0.7–4.0)
MCH: 32.1 pg (ref 26.0–34.0)
MCHC: 34.4 g/dL (ref 30.0–36.0)
MCV: 93.3 fL (ref 80.0–100.0)
Monocytes Absolute: 0.4 10*3/uL (ref 0.1–1.0)
Monocytes Relative: 6 %
Neutro Abs: 4.3 10*3/uL (ref 1.7–7.7)
Neutrophils Relative %: 66 %
Platelet Count: 197 10*3/uL (ref 150–400)
RBC: 4.17 MIL/uL — ABNORMAL LOW (ref 4.22–5.81)
RDW: 12.5 % (ref 11.5–15.5)
WBC Count: 6.6 10*3/uL (ref 4.0–10.5)
nRBC: 0 % (ref 0.0–0.2)

## 2019-05-07 LAB — CMP (CANCER CENTER ONLY)
ALT: 16 U/L (ref 0–44)
AST: 21 U/L (ref 15–41)
Albumin: 4.2 g/dL (ref 3.5–5.0)
Alkaline Phosphatase: 79 U/L (ref 38–126)
Anion gap: 8 (ref 5–15)
BUN: 23 mg/dL (ref 8–23)
CO2: 28 mmol/L (ref 22–32)
Calcium: 9.3 mg/dL (ref 8.9–10.3)
Chloride: 104 mmol/L (ref 98–111)
Creatinine: 0.85 mg/dL (ref 0.61–1.24)
GFR, Est AFR Am: >60 mL/min (ref >60)
GFR, Estimated: >60 mL/min (ref >60)
Glucose, Bld: 108 mg/dL — ABNORMAL HIGH (ref 70–99)
Potassium: 3.6 mmol/L (ref 3.5–5.1)
Sodium: 140 mmol/L (ref 135–145)
Total Bilirubin: 1 mg/dL (ref 0.3–1.2)
Total Protein: 6.7 g/dL (ref 6.5–8.1)

## 2019-05-07 LAB — D-DIMER, QUANTITATIVE: D-Dimer, Quant: <0.27 ug/mL-FEU (ref 0.00–0.50)

## 2019-05-07 NOTE — Progress Notes (Signed)
Hematology and Oncology Follow Up Visit  Steven Roy [id number] 1956-04-20 63 y.o. 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 transient  Current Therapy:    Eliquis 5 mg p.o. twice daily -to complete 1 year of therapeutic anticoagulation in December 2020     Interim History:  Steven Roy is back for follow-up.  So far, he is doing quite well.  We did get the CT angiogram on him in early June.  This did not show any evidence of residual pulmonary embolism.  When we first saw him, he did have a positive lupus anticoagulant.  We will have to check this with this visit.  He is doing well on the Eliquis.  There is no problems with bleeding.  He has had no headache.  He has had no chest wall pain.  He has had no change in bowel or bladder habits.  There is been no leg swelling.  He has had no leg pain.  His appetite is good.  Overall, his performance status is ECOG 0.    Medications:  Current Outpatient Medications:  .  apixaban (ELIQUIS) 5 MG TABS tablet, Take 5 mg by mouth 2 (two) times daily., Disp: , Rfl:  .  atorvastatin (LIPITOR) 80 MG tablet, Take 80 mg by mouth daily., Disp: , Rfl:  .  Multiple Vitamins-Minerals (MENS MULTIVITAMIN PLUS) TABS, Take 1 tablet by mouth daily., Disp: , Rfl:  .  sildenafil (VIAGRA) 50 MG tablet, 50 mg once daily as needed 1 hour before sexual activity; may be taken up to 4 hours before sexual activity. Reduce to 25 mg once daily if side effects occur. May increase to a maximum dose of 100 mg once daily if there is incomplete response., Disp: , Rfl:  .  apixaban (ELIQUIS) 5 MG TABS tablet, Take 1 tablet (5 mg total) by mouth 2 (two) times daily for 30 days. Start after finishing 10mg  bid dosing, Disp: 60 tablet, Rfl: 2  Allergies: No Known Allergies  Past Medical History, Surgical history, Social history, and  Family History were reviewed and updated.  Review of Systems: Review of Systems  Constitutional: Negative.   HENT:  Negative.   Eyes: Negative.   Respiratory: Negative.   Cardiovascular: Negative.   Gastrointestinal: Negative.   Endocrine: Negative.   Genitourinary: Negative.    Musculoskeletal: Negative.   Skin: Negative.   Neurological: Negative.   Hematological: Negative.   Psychiatric/Behavioral: Negative.     Physical Exam:  weight is 203 lb (92.1 kg). His oral temperature is 98.3 F (36.8 C). His blood pressure is 109/68 and his pulse is 75. His respiration is 20 and oxygen saturation is 98%.   Wt Readings from Last 3 Encounters:  05/07/19 203 lb (92.1 kg)  03/05/19 204 lb (92.5 kg)  01/22/19 202 lb 1.9 oz (91.7 kg)    Physical Exam Vitals signs reviewed.  HENT:     Head: Normocephalic and atraumatic.  Eyes:     Pupils: Pupils are equal, round, and reactive to light.  Neck:     Musculoskeletal: Normal range of motion.  Cardiovascular:     Rate and Rhythm: Normal rate and regular rhythm.     Heart sounds: Normal heart sounds.  Pulmonary:     Effort: Pulmonary effort is normal.     Breath sounds: Normal breath sounds.  Abdominal:     General: Bowel sounds are  normal.     Palpations: Abdomen is soft.  Musculoskeletal: Normal range of motion.        General: No tenderness or deformity.  Lymphadenopathy:     Cervical: No cervical adenopathy.  Skin:    General: Skin is warm and dry.     Findings: No erythema or rash.  Neurological:     Mental Status: He is alert and oriented to person, place, and time.  Psychiatric:        Behavior: Behavior normal.        Thought Content: Thought content normal.        Judgment: Judgment normal.      Lab Results  Component Value Date   WBC 6.6 05/07/2019   HGB 13.4 05/07/2019   HCT 38.9 (L) 05/07/2019   MCV 93.3 05/07/2019   PLT 197 05/07/2019     Chemistry      Component Value Date/Time   NA 140 05/07/2019  1213   K 3.6 05/07/2019 1213   CL 104 05/07/2019 1213   CO2 28 05/07/2019 1213   BUN 23 05/07/2019 1213   CREATININE 0.85 05/07/2019 1213      Component Value Date/Time   CALCIUM 9.3 05/07/2019 1213   ALKPHOS 79 05/07/2019 1213   AST 21 05/07/2019 1213   ALT 16 05/07/2019 1213   BILITOT 1.0 05/07/2019 1213       Impression and Plan: Steven Roy is a 63 year old white male.  He had extensive thromboembolic disease after having prostate cancer surgery.  He was found to have a lupus anticoagulant when we saw him.  He will stay on Eliquis right now.  Again he will be on Eliquis for 1 year as therapeutic treatment and then 1 year as maintenance therapy.  I will plan to see him back sometime around the holidays.  At that time, we probably will switch him over to maintenance therapy with Eliquis at 2.5 mg p.o. twice daily.  We will see what the follow-up lupus anticoagulant screen shows.   [name], MD 6/26/20201:09 PM

## 2019-05-10 LAB — LUPUS ANTICOAGULANT PANEL
DRVVT: 100.9 s — ABNORMAL HIGH (ref 0.0–47.0)
PTT Lupus Anticoagulant: 50.4 s (ref 0.0–51.9)

## 2019-05-10 LAB — DRVVT CONFIRM: dRVVT Confirm: 1.5 ratio — ABNORMAL HIGH (ref 0.8–1.2)

## 2019-05-10 LAB — DRVVT MIX: dRVVT Mix: 54.3 s — ABNORMAL HIGH (ref 0.0–47.0)

## 2019-05-26 ENCOUNTER — Telehealth: Payer: Self-pay | Admitting: Hematology & Oncology

## 2019-05-26 NOTE — Telephone Encounter (Signed)
lmom to inform patient of resched appt on 11/27 to 11/30 at 1130 am per [facility] 7/15 office will potentially be closed 10/08/19

## 2019-05-28 ENCOUNTER — Other Ambulatory Visit: Payer: Self-pay | Admitting: *Deleted

## 2019-05-28 MED ORDER — APIXABAN 5 MG PO TABS: 5.0000 mg | ORAL_TABLET | Freq: Two times a day (BID) | ORAL | 5 refills | Status: AC

## 2019-05-28 MED FILL — SILDENAFIL CITRATE 50 MG TA: 50 | 30 days supply | Qty: 6 | Fill #3

## 2019-05-28 MED FILL — ELIQUIS 5 MG TABLET: 5 | 30 days supply | Qty: 60 | Fill #0

## 2019-06-23 MED FILL — ELIQUIS 5 MG TABLET: 5 | 30 days supply | Qty: 60 | Fill #1

## 2019-06-23 MED FILL — SILDENAFIL CITRATE 50 MG TA: 50 | 30 days supply | Qty: 6 | Fill #4

## 2019-07-29 MED FILL — ELIQUIS 5 MG TABLET: 5 | 30 days supply | Qty: 60 | Fill #2

## 2019-07-29 MED FILL — SILDENAFIL CITRATE 50 MG TA: 50 | 30 days supply | Qty: 6 | Fill #5

## 2019-09-01 MED FILL — SILDENAFIL CITRATE 50 MG TA: 50 | 30 days supply | Qty: 6 | Fill #6

## 2019-09-01 MED FILL — ELIQUIS 5 MG TABLET: 5 | 30 days supply | Qty: 60 | Fill #3

## 2019-09-06 MED FILL — ATORVASTATIN 80 MG TABLET: 80 | 90 days supply | Qty: 90 | Fill #0

## 2019-10-05 MED FILL — ELIQUIS 5 MG TABLET: 5 | 30 days supply | Qty: 60 | Fill #4

## 2019-10-08 ENCOUNTER — Other Ambulatory Visit: Payer: 59

## 2019-10-08 ENCOUNTER — Ambulatory Visit: Payer: 59 | Admitting: Hematology & Oncology

## 2019-10-11 ENCOUNTER — Inpatient Hospital Stay: Payer: 59 | Attending: Hematology & Oncology

## 2019-10-11 ENCOUNTER — Encounter: Payer: Self-pay | Admitting: Hematology & Oncology

## 2019-10-11 ENCOUNTER — Inpatient Hospital Stay (HOSPITAL_BASED_OUTPATIENT_CLINIC_OR_DEPARTMENT_OTHER): Payer: 59 | Admitting: Hematology & Oncology

## 2019-10-11 ENCOUNTER — Other Ambulatory Visit: Payer: Self-pay

## 2019-10-11 VITALS — BP 129/73 | HR 70 | Temp 97.1°F | Resp 18 | Wt 209.0 lb

## 2019-10-11 DIAGNOSIS — D6862 Lupus anticoagulant syndrome: Secondary | ICD-10-CM | POA: Diagnosis not present

## 2019-10-11 DIAGNOSIS — Z8546 Personal history of malignant neoplasm of prostate: Secondary | ICD-10-CM | POA: Diagnosis not present

## 2019-10-11 DIAGNOSIS — C61 Malignant neoplasm of prostate: Secondary | ICD-10-CM

## 2019-10-11 DIAGNOSIS — Z7901 Long term (current) use of anticoagulants: Secondary | ICD-10-CM | POA: Diagnosis not present

## 2019-10-11 DIAGNOSIS — I2601 Septic pulmonary embolism with acute cor pulmonale: Secondary | ICD-10-CM | POA: Diagnosis not present

## 2019-10-11 DIAGNOSIS — Z86711 Personal history of pulmonary embolism: Secondary | ICD-10-CM | POA: Insufficient documentation

## 2019-10-11 LAB — CMP (CANCER CENTER ONLY)
ALT: 15 U/L (ref 0–44)
AST: 21 U/L (ref 15–41)
Albumin: 4.7 g/dL (ref 3.5–5.0)
Alkaline Phosphatase: 84 U/L (ref 38–126)
Anion gap: 8 (ref 5–15)
BUN: 27 mg/dL — ABNORMAL HIGH (ref 8–23)
CO2: 27 mmol/L (ref 22–32)
Calcium: 9.3 mg/dL (ref 8.9–10.3)
Chloride: 102 mmol/L (ref 98–111)
Creatinine: 0.91 mg/dL (ref 0.61–1.24)
GFR, Est AFR Am: >60 mL/min (ref >60)
GFR, Estimated: >60 mL/min (ref >60)
Glucose, Bld: 94 mg/dL (ref 70–99)
Potassium: 4 mmol/L (ref 3.5–5.1)
Sodium: 137 mmol/L (ref 135–145)
Total Bilirubin: 0.9 mg/dL (ref 0.3–1.2)
Total Protein: 7.3 g/dL (ref 6.5–8.1)

## 2019-10-11 LAB — CBC WITH DIFFERENTIAL (CANCER CENTER ONLY)
Abs Immature Granulocytes: 0.02 10*3/uL (ref 0.00–0.07)
Basophils Absolute: 0.1 10*3/uL (ref 0.0–0.1)
Basophils Relative: 1 %
Eosinophils Absolute: 0.3 10*3/uL (ref 0.0–0.5)
Eosinophils Relative: 5 %
HCT: 40 % (ref 39.0–52.0)
Hemoglobin: 14.1 g/dL (ref 13.0–17.0)
Immature Granulocytes: 0 %
Lymphocytes Relative: 24 %
Lymphs Abs: 1.7 10*3/uL (ref 0.7–4.0)
MCH: 31.6 pg (ref 26.0–34.0)
MCHC: 35.3 g/dL (ref 30.0–36.0)
MCV: 89.7 fL (ref 80.0–100.0)
Monocytes Absolute: 0.5 10*3/uL (ref 0.1–1.0)
Monocytes Relative: 7 %
Neutro Abs: 4.7 10*3/uL (ref 1.7–7.7)
Neutrophils Relative %: 63 %
Platelet Count: 215 10*3/uL (ref 150–400)
RBC: 4.46 MIL/uL (ref 4.22–5.81)
RDW: 12.4 % (ref 11.5–15.5)
WBC Count: 7.4 10*3/uL (ref 4.0–10.5)
nRBC: 0 % (ref 0.0–0.2)

## 2019-10-11 LAB — D-DIMER, QUANTITATIVE: D-Dimer, Quant: <0.27 ug/mL-FEU (ref 0.00–0.50)

## 2019-10-11 NOTE — Progress Notes (Signed)
Hematology and Oncology Follow Up Visit  Steven Roy [id number] 05-Nov-1956 63 y.o. 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 transient  Current Therapy:    Eliquis 5 mg p.o. twice daily -to complete 1 year of therapeutic anticoagulation in December 2020  Eliquis 2.5 mg p.o. twice daily-to complete 1 year of maintenance therapy in December 2021     Interim History:  Mr. Steven Roy is back for follow-up.  So far, he is doing quite well.  He says he does feel little tired.  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  He has had no problems with bleeding.  He has had no cough or shortness of breath.  He has had no leg swelling.  I am not sure why he does feel tired.  Maybe, his testosterone level is on the low side.  However, he really cannot be on supplemental testosterone because of his thromboembolic disease.  He has had no rashes.  He has had no fever.  He did have a very nice Thanksgiving.  His family came in from out of town.  Overall, his performance status is ECOG 0.    Medications:  Current Outpatient Medications:  .  apixaban (ELIQUIS) 5 MG TABS tablet, Take 1 tablet (5 mg total) by mouth 2 (two) times daily., Disp: 60 tablet, Rfl: 5 .  atorvastatin (LIPITOR) 80 MG tablet, Take 80 mg by mouth daily., Disp: , Rfl:  .  Multiple Vitamins-Minerals (MENS MULTIVITAMIN PLUS) TABS, Take 1 tablet by mouth daily., Disp: , Rfl:  .  sildenafil (VIAGRA) 50 MG tablet, 50 mg once daily as needed 1 hour before sexual activity; may be taken up to 4 hours before sexual activity. Reduce to 25 mg once daily if side effects occur. May increase to a maximum dose of 100 mg once daily if there is incomplete response., Disp: , Rfl:   Allergies: No Known Allergies  Past Medical History, Surgical history, Social history, and Family  History were reviewed and updated.  Review of Systems: Review of Systems  Constitutional: Negative.   HENT:  Negative.   Eyes: Negative.   Respiratory: Negative.   Cardiovascular: Negative.   Gastrointestinal: Negative.   Endocrine: Negative.   Genitourinary: Negative.    Musculoskeletal: Negative.   Skin: Negative.   Neurological: Negative.   Hematological: Negative.   Psychiatric/Behavioral: Negative.     Physical Exam:  weight is 209 lb (94.8 kg). His temporal temperature is 97.1 F (36.2 C) (abnormal). His blood pressure is 129/73 and his pulse is 70. His respiration is 18 and oxygen saturation is 99%.   Wt Readings from Last 3 Encounters:  10/11/19 209 lb (94.8 kg)  05/07/19 203 lb (92.1 kg)  03/05/19 204 lb (92.5 kg)    Physical Exam Vitals signs reviewed.  HENT:     Head: Normocephalic and atraumatic.  Eyes:     Pupils: Pupils are equal, round, and reactive to light.  Neck:     Musculoskeletal: Normal range of motion.  Cardiovascular:     Rate and Rhythm: Normal rate and regular rhythm.     Heart sounds: Normal heart sounds.  Pulmonary:     Effort: Pulmonary effort is normal.     Breath sounds: Normal breath sounds.  Abdominal:     General: Bowel sounds are normal.     Palpations: Abdomen is soft.  Musculoskeletal: Normal range of motion.        General: No tenderness or deformity.  Lymphadenopathy:     Cervical: No cervical adenopathy.  Skin:    General: Skin is warm and dry.     Findings: No erythema or rash.  Neurological:     Mental Status: He is alert and oriented to person, place, and time.  Psychiatric:        Behavior: Behavior normal.        Thought Content: Thought content normal.        Judgment: Judgment normal.      Lab Results  Component Value Date   WBC 7.4 10/11/2019   HGB 14.1 10/11/2019   HCT 40.0 10/11/2019   MCV 89.7 10/11/2019   PLT 215 10/11/2019     Chemistry      Component Value Date/Time   NA 137 10/11/2019 1125    K 4.0 10/11/2019 1125   CL 102 10/11/2019 1125   CO2 27 10/11/2019 1125   BUN 27 (H) 10/11/2019 1125   CREATININE 0.91 10/11/2019 1125      Component Value Date/Time   CALCIUM 9.3 10/11/2019 1125   ALKPHOS 84 10/11/2019 1125   AST 21 10/11/2019 1125   ALT 15 10/11/2019 1125   BILITOT 0.9 10/11/2019 1125       Impression and Plan: Mr. Steven Roy is a 64 year old white male.  He had extensive thromboembolic disease after having prostate cancer surgery.  He was found to have a lupus anticoagulant when we saw him.  He will stay on Eliquis right now.  I will now move him down to maintenance Eliquis at 2.5 mg p.o. twice daily.  We will keep him on this for 1 year.   I will plan to see him back in 6 months.  At the 63-month follow-up is reasonable.    We will see what the follow-up lupus anticoagulant screen shows.   [name], MD 11/30/202012:45 PM

## 2019-10-12 ENCOUNTER — Encounter: Payer: Self-pay | Admitting: *Deleted

## 2019-10-12 LAB — PSA, TOTAL AND FREE
PSA, Free Pct: UNDETERMINED %
PSA, Free: <0.02 ng/mL
Prostate Specific Ag, Serum: <0.1 ng/mL (ref 0.0–4.0)

## 2019-11-01 MED FILL — ELIQUIS 5 MG TABLET: 5 | 30 days supply | Qty: 60 | Fill #5

## 2019-11-24 IMAGING — NM NM BONE WHOLE BODY
2 series · 2 of 2 positions shown · non-contrast
Comparison: None

Correlation: CT abdomen and pelvis 08/19/2018

CLINICAL DATA: Recent diagnosis of prostate cancer, PSA

EXAM:
NUCLEAR MEDICINE WHOLE BODY BONE SCAN
TECHNIQUE: Whole body anterior and posterior images were obtained approximately
3 hours after intravenous injection of radiopharmaceutical.
RADIOPHARMACEUTICALS:  20.5 mCi Lechnetium-66m MDP IV

[Series 1: whole body · 2.66mm/px · 1 of 1 slices shown (1 of 2)]
[im 1/1]
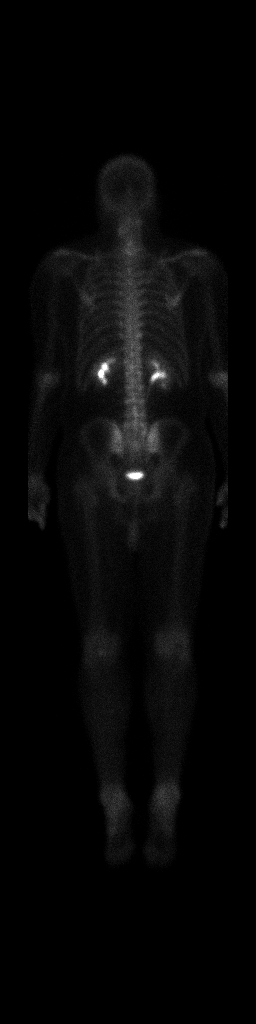

[Series 1: whole body · 2.66mm/px · 1 of 1 slices shown (2 of 2)]
[im 1/1]
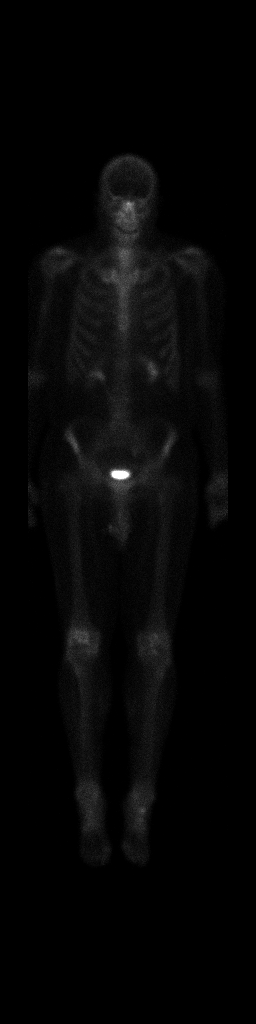

[2 of 2 positions shown; findings below may reference images not displayed]

FINDINGS: Minimal uptake of tracer at the knees and ankles, typically
degenerative.

No abnormal sites of osseous tracer accumulation are identified to
suggest osseous metastatic disease.

Expected urinary tract and soft tissue distribution of tracer.
IMPRESSION: No scintigraphic evidence of osseous metastatic disease.

## 2019-12-09 DIAGNOSIS — Z0000 Encounter for general adult medical examination without abnormal findings: Secondary | ICD-10-CM | POA: Diagnosis not present

## 2019-12-09 DIAGNOSIS — I2699 Other pulmonary embolism without acute cor pulmonale: Secondary | ICD-10-CM | POA: Diagnosis not present

## 2019-12-09 DIAGNOSIS — C61 Malignant neoplasm of prostate: Secondary | ICD-10-CM | POA: Diagnosis not present

## 2019-12-09 DIAGNOSIS — D509 Iron deficiency anemia, unspecified: Secondary | ICD-10-CM | POA: Diagnosis not present

## 2019-12-09 DIAGNOSIS — E782 Mixed hyperlipidemia: Secondary | ICD-10-CM | POA: Diagnosis not present

## 2019-12-09 DIAGNOSIS — R5382 Chronic fatigue, unspecified: Secondary | ICD-10-CM | POA: Diagnosis not present

## 2019-12-09 MED FILL — MODAFINIL 100 MG TABLET: 100 | 30 days supply | Qty: 30 | Fill #0

## 2020-01-18 IMAGING — CT CT ANGIO CHEST
2 of 6 series · 18 of 46 positions shown · IV contrast (ISOVUE)
Comparison: Chest radiograph from earlier today.

CLINICAL DATA: Recent surgery. Severe dyspnea since 3 a.m. this
morning. Hypoxia.

EXAM:
CT ANGIOGRAPHY CHEST WITH CONTRAST
TECHNIQUE: Multidetector CT imaging of the chest was performed using the
standard protocol during bolus administration of intravenous
contrast. Multiplanar CT image reconstructions and MIPs were
obtained to evaluate the vascular anatomy.
CONTRAST:  100mL SDH1G7-BE3 IOPAMIDOL (SDH1G7-BE3) INJECTION 76%

[Series 5: thins · axial · 0.91mm/px · z∈[+1347,+1615]mm · 15 of 294 slices shown]
[im 13/294  lung]
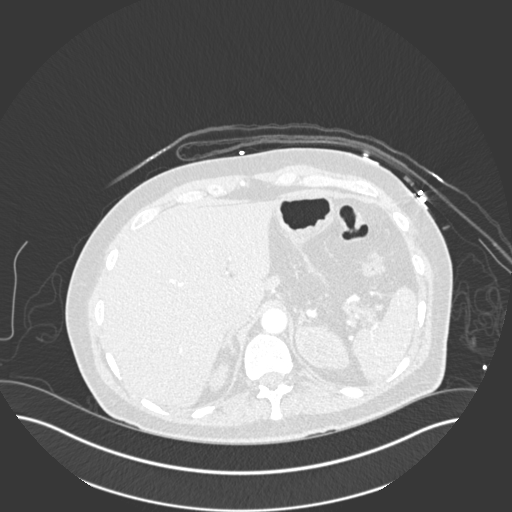
[im 39/294  soft-tissue]
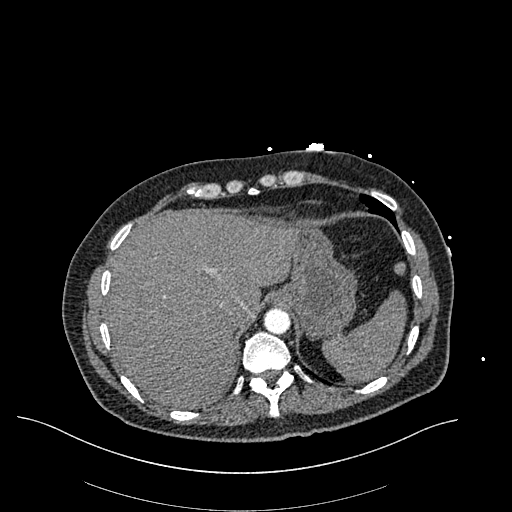
[im 51/294  lung]
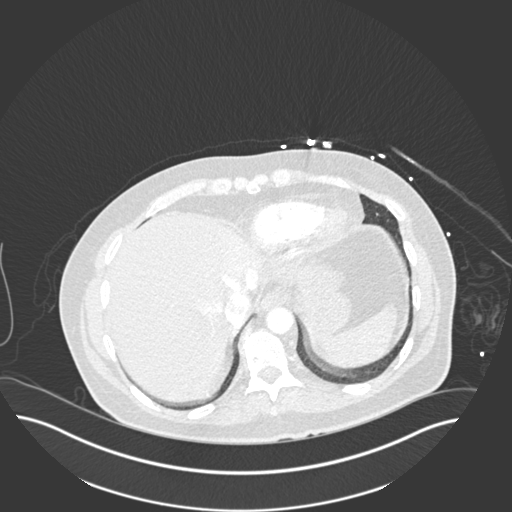
[im 77/294  soft-tissue]
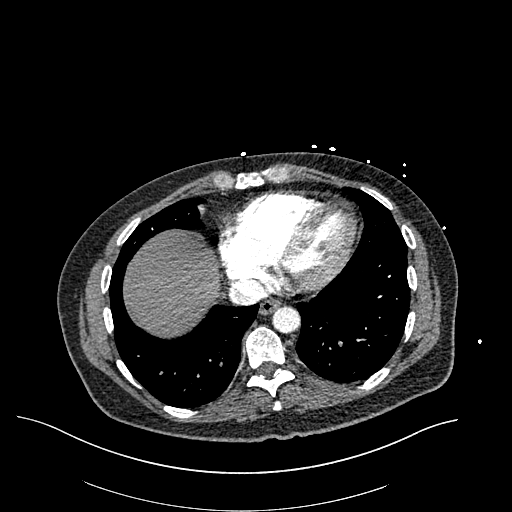
[im 90/294  lung]
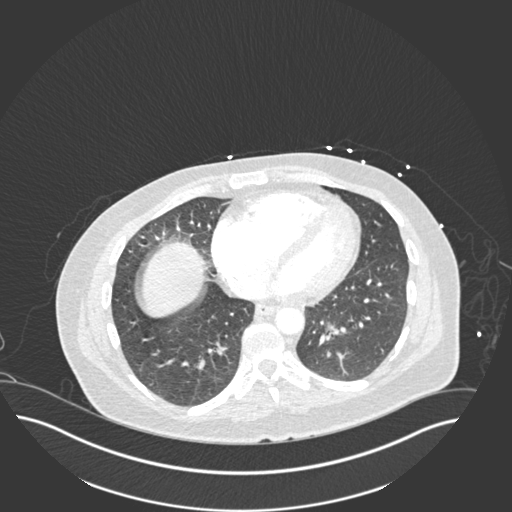
[im 115/294  soft-tissue]
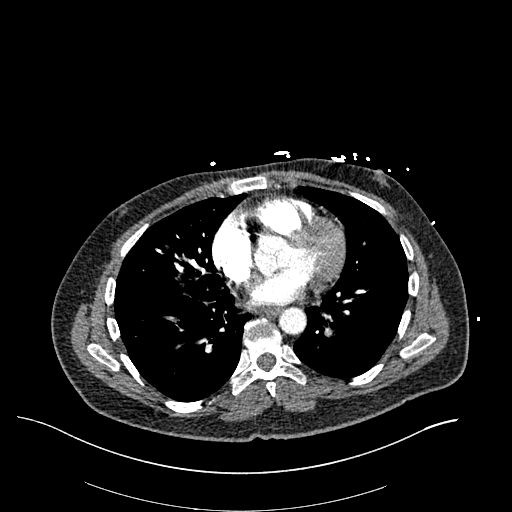
[im 128/294  lung]
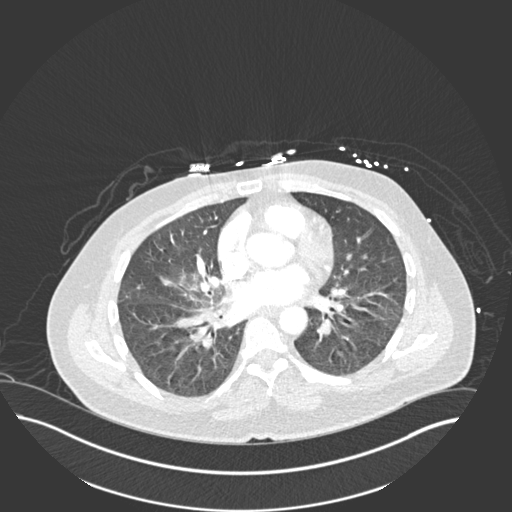
[im 153/294  soft-tissue]
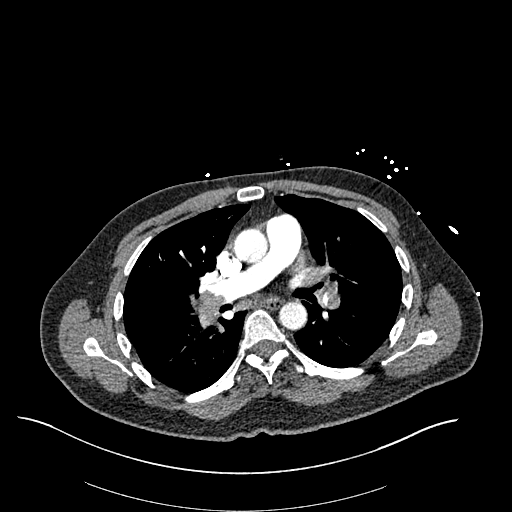
[im 166/294  lung]
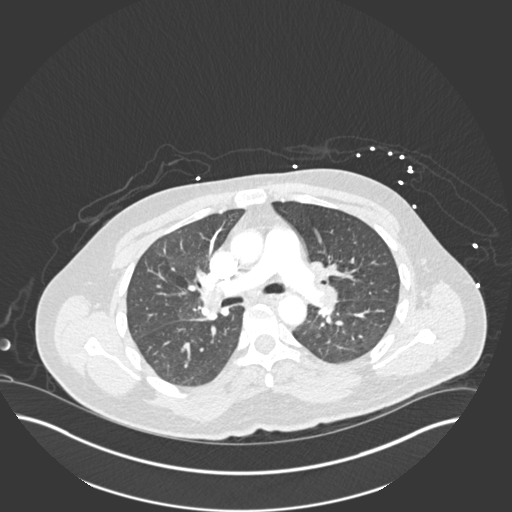
[im 179/294  soft-tissue]
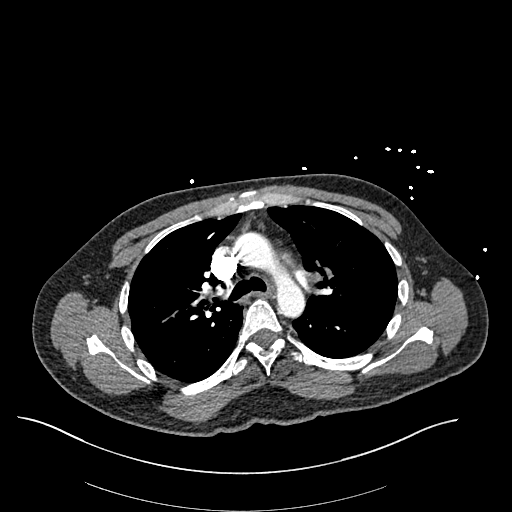
[im 204/294  lung]
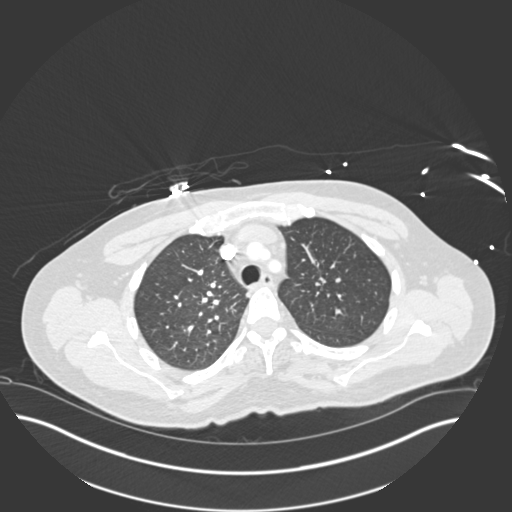
[im 217/294  soft-tissue]
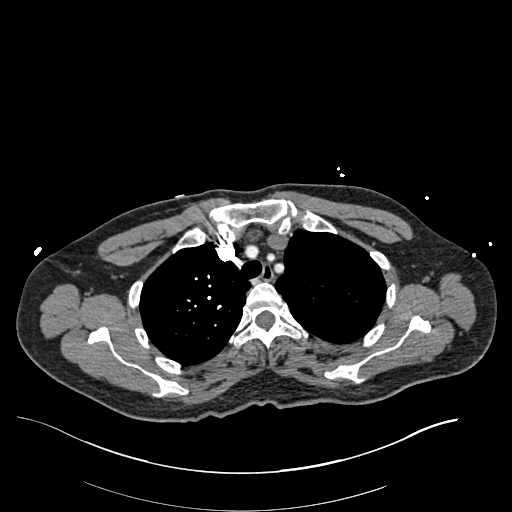
[im 243/294  lung]
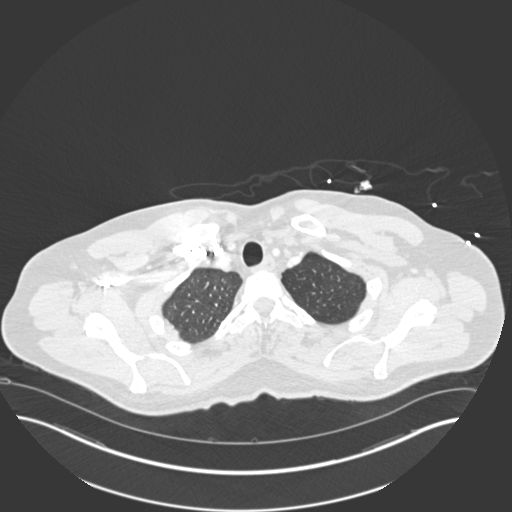
[im 255/294  soft-tissue]
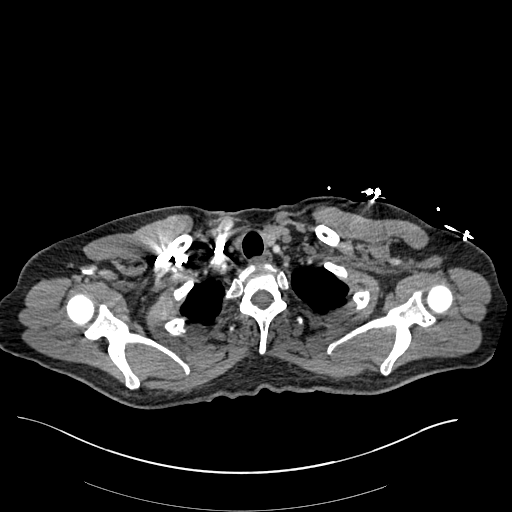
[im 281/294  lung]
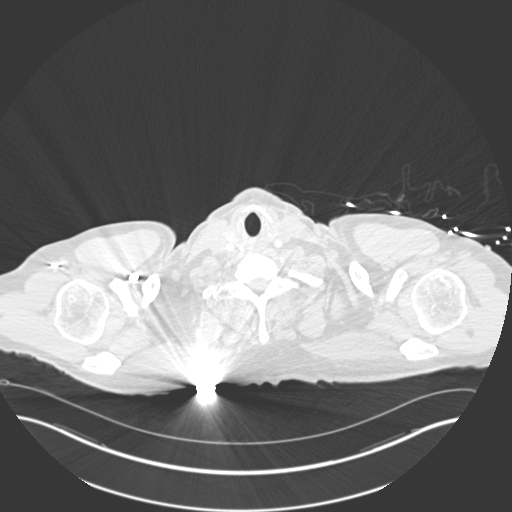

[Series 6: coronal mpr · coronal · 0.60mm/px · 3 of 151 slices shown]
[im 38/151  soft-tissue]
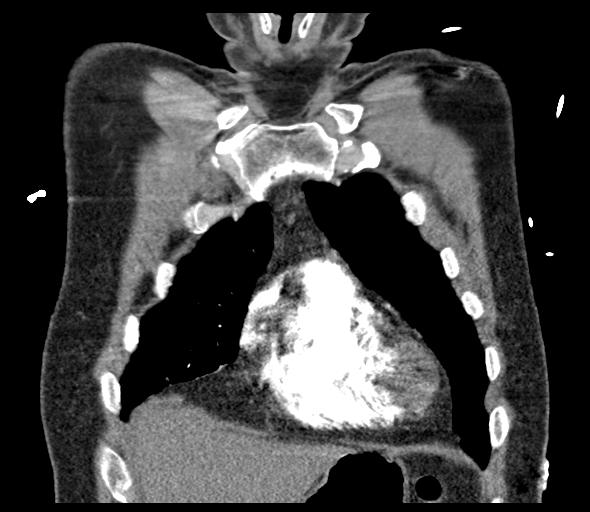
[im 76/151  soft-tissue]
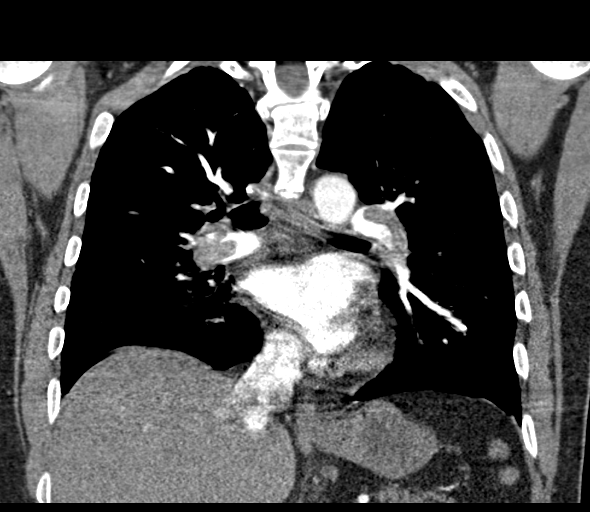
[im 113/151  soft-tissue]
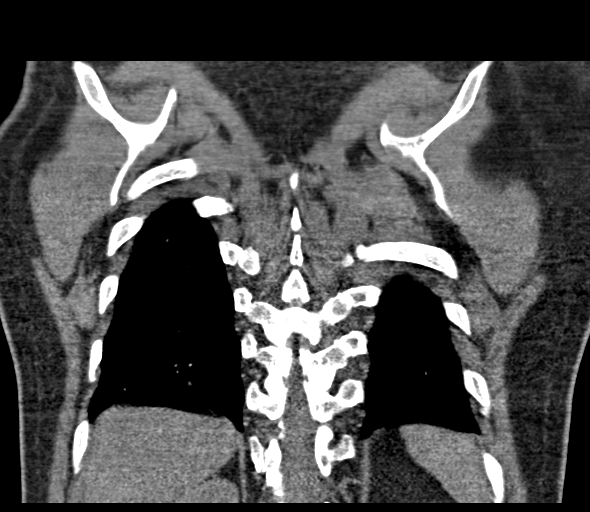

[18 of 46 positions shown; findings below may reference images not displayed]

FINDINGS: Cardiovascular: The study is high quality for the evaluation of
pulmonary embolism. There is a large burden of acute pulmonary
embolism bilaterally involving the lobar, segmental and subsegmental
branches of all lung lobes. The most proximal clot is located within
the left pulmonary artery. No saddle emboli. Great vessels are
normal in course and caliber. Normal heart size. No significant
pericardial fluid/thickening. RV LV ratio 2.1.

Mediastinum/Nodes: No discrete thyroid nodules. Unremarkable
esophagus. No pathologically enlarged axillary, mediastinal or hilar
lymph nodes.

Lungs/Pleura: No pneumothorax. No pleural effusion. Patchy
ground-glass opacity in the right middle and left lower lobes. No
lung masses or significant pulmonary nodules.

Upper abdomen: Small amount of contrast reflux into the IVC and
hepatic veins.

Musculoskeletal:  No aggressive appearing focal osseous lesions.

Review of the MIP images confirms the above findings.
IMPRESSION: 1. Acute bilateral pulmonary embolism with large clot burden. Most
proximal clot is located within the left pulmonary artery. No saddle
emboli.
2. Positive for acute PE with CT evidence of right heart strain
(RV/LV Ratio = 2.1) consistent with at least submassive
(intermediate risk) PE. The presence of right heart strain has been
associated with an increased risk of morbidity and mortality. Please
activate Code PE by paging [phone].
3. Patchy ground-glass opacity in the right middle and left lower
lobes, nonspecific, which could represent alveolar hemorrhage
related to developing pulmonary infarcts versus aspiration.

Critical Value/emergent results were called by telephone at the time
of interpretation on 10/14/2018 at [DATE] to Dr. DAISY
NOLAND , who verbally acknowledged these results.

## 2020-02-03 MED FILL — SILDENAFIL CITRATE 100 MG T: 100 | 90 days supply | Qty: 18 | Fill #0

## 2020-02-03 MED FILL — ATORVASTATIN 80 MG TABLET: 80 | 90 days supply | Qty: 90 | Fill #0

## 2020-02-29 DIAGNOSIS — C61 Malignant neoplasm of prostate: Secondary | ICD-10-CM | POA: Diagnosis not present

## 2020-03-06 DIAGNOSIS — N5231 Erectile dysfunction following radical prostatectomy: Secondary | ICD-10-CM | POA: Diagnosis not present

## 2020-03-06 DIAGNOSIS — C775 Secondary and unspecified malignant neoplasm of intrapelvic lymph nodes: Secondary | ICD-10-CM | POA: Diagnosis not present

## 2020-03-06 DIAGNOSIS — C61 Malignant neoplasm of prostate: Secondary | ICD-10-CM | POA: Diagnosis not present

## 2020-03-06 DIAGNOSIS — N393 Stress incontinence (female) (male): Secondary | ICD-10-CM | POA: Diagnosis not present

## 2020-03-27 ENCOUNTER — Encounter: Payer: Self-pay | Admitting: Hematology & Oncology

## 2020-03-27 ENCOUNTER — Telehealth: Payer: Self-pay | Admitting: *Deleted

## 2020-03-27 NOTE — Telephone Encounter (Signed)
Pt notified via my chart to take 2 baby aspirin daily, stop Eliquis.

## 2020-04-03 ENCOUNTER — Inpatient Hospital Stay: Payer: 59 | Admitting: Hematology & Oncology

## 2020-04-03 ENCOUNTER — Inpatient Hospital Stay: Payer: 59

## 2020-05-02 MED FILL — ATORVASTATIN 80 MG TABLET: 80 | 90 days supply | Qty: 90 | Fill #1

## 2020-05-02 MED FILL — SILDENAFIL CITRATE 100 MG T: 100 | 90 days supply | Qty: 18 | Fill #1

## 2020-05-05 ENCOUNTER — Other Ambulatory Visit (HOSPITAL_COMMUNITY): Payer: Self-pay | Admitting: Family Medicine

## 2020-09-14 MED FILL — ATORVASTATIN 80 MG TABLET: 80 | 90 days supply | Qty: 90 | Fill #0

## 2020-12-27 MED FILL — ATORVASTATIN 80 MG TABLET: 80 | 90 days supply | Qty: 90 | Fill #1

## 2021-09-18 DIAGNOSIS — C61 Malignant neoplasm of prostate: Secondary | ICD-10-CM | POA: Diagnosis not present

## 2021-10-01 DIAGNOSIS — C775 Secondary and unspecified malignant neoplasm of intrapelvic lymph nodes: Secondary | ICD-10-CM | POA: Diagnosis not present

## 2021-10-01 DIAGNOSIS — N5231 Erectile dysfunction following radical prostatectomy: Secondary | ICD-10-CM | POA: Diagnosis not present

## 2021-10-01 DIAGNOSIS — C61 Malignant neoplasm of prostate: Secondary | ICD-10-CM | POA: Diagnosis not present

## 2021-10-26 DIAGNOSIS — D509 Iron deficiency anemia, unspecified: Secondary | ICD-10-CM | POA: Diagnosis not present

## 2021-10-26 DIAGNOSIS — Z23 Encounter for immunization: Secondary | ICD-10-CM | POA: Diagnosis not present

## 2021-10-26 DIAGNOSIS — Z0000 Encounter for general adult medical examination without abnormal findings: Secondary | ICD-10-CM | POA: Diagnosis not present

## 2021-10-26 DIAGNOSIS — C61 Malignant neoplasm of prostate: Secondary | ICD-10-CM | POA: Diagnosis not present

## 2021-10-26 DIAGNOSIS — E782 Mixed hyperlipidemia: Secondary | ICD-10-CM | POA: Diagnosis not present

## 2021-11-22 ENCOUNTER — Other Ambulatory Visit (HOSPITAL_COMMUNITY): Payer: Self-pay

## 2021-11-22 MED ORDER — ENOXAPARIN SODIUM 40 MG/0.4ML IJ SOSY
PREFILLED_SYRINGE | INTRAMUSCULAR | 0 refills | Status: DC
  Filled 2021-11-22: qty 11.2, 14d supply, fill #0

## 2021-11-22 MED ORDER — ENOXAPARIN SODIUM 40 MG/0.4ML IJ SOSY
PREFILLED_SYRINGE | INTRAMUSCULAR | 0 refills | Status: AC
  Filled 2021-11-22: qty 5.6, 14d supply, fill #0

## 2021-11-27 ENCOUNTER — Other Ambulatory Visit (HOSPITAL_COMMUNITY): Payer: Self-pay

## 2021-12-05 ENCOUNTER — Other Ambulatory Visit (HOSPITAL_COMMUNITY): Payer: Self-pay

## 2022-04-04 DIAGNOSIS — C61 Malignant neoplasm of prostate: Secondary | ICD-10-CM | POA: Diagnosis not present

## 2022-04-12 ENCOUNTER — Other Ambulatory Visit (HOSPITAL_COMMUNITY): Payer: Self-pay | Admitting: Urology

## 2022-04-12 DIAGNOSIS — C61 Malignant neoplasm of prostate: Secondary | ICD-10-CM

## 2022-04-17 ENCOUNTER — Encounter (HOSPITAL_COMMUNITY)
Admission: RE | Admit: 2022-04-17 | Discharge: 2022-04-17 | Disposition: A | Discharge status: Home / Self Care | Payer: Medicare Other | Source: Ambulatory Visit | Attending: Urology | Admitting: Urology

## 2022-04-17 DIAGNOSIS — C61 Malignant neoplasm of prostate: Secondary | ICD-10-CM | POA: Diagnosis present

## 2022-04-17 MED ORDER — PIFLIFOLASTAT F 18 (PYLARIFY) INJECTION: 9.0000 | Freq: Once | INTRAVENOUS | Status: DC
